# Patient Record
Sex: Male | Born: 1970
Health system: Southern US, Community
[De-identification: ages and names within clinical notes are randomized; demographics above are authoritative.]

## PROBLEM LIST (undated history)

## (undated) DIAGNOSIS — R040 Epistaxis: Secondary | ICD-10-CM

## (undated) DIAGNOSIS — T7840XA Allergy, unspecified, initial encounter: Secondary | ICD-10-CM

## (undated) DIAGNOSIS — E785 Hyperlipidemia, unspecified: Secondary | ICD-10-CM

## (undated) DIAGNOSIS — R062 Wheezing: Secondary | ICD-10-CM

## (undated) HISTORY — DX: Wheezing: R06.2

## (undated) HISTORY — DX: Epistaxis: R04.0

## (undated) HISTORY — DX: Hyperlipidemia, unspecified: E78.5

## (undated) HISTORY — DX: Allergy, unspecified, initial encounter: T78.40XA

---
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
## 2004-10-19 ENCOUNTER — Encounter: Admission: RE | Admit: 2004-10-19 | Discharge: 2004-10-19 | Payer: Self-pay | Admitting: Neurology

## 2006-10-28 ENCOUNTER — Ambulatory Visit: Payer: Self-pay | Admitting: Internal Medicine

## 2006-10-28 LAB — CONVERTED CEMR LAB
ALT: 27 units/L (ref 0–53)
AST: 29 units/L (ref 0–37)
Albumin: 4.4 g/dL (ref 3.5–5.2)
Alkaline Phosphatase: 60 units/L (ref 39–117)
BUN: 9 mg/dL (ref 6–23)
Basophils Absolute: 0 10*3/uL (ref 0.0–0.1)
Basophils Relative: 0.3 % (ref 0.0–1.0)
Bilirubin Urine: NEGATIVE
Bilirubin, Direct: 0.1 mg/dL (ref 0.0–0.3)
CO2: 30 meq/L (ref 19–32)
Calcium: 9.2 mg/dL (ref 8.4–10.5)
Chloride: 106 meq/L (ref 96–112)
Cholesterol: 247 mg/dL (ref 0–200)
Creatinine, Ser: 1 mg/dL (ref 0.4–1.5)
Direct LDL: 175.6 mg/dL
Eosinophils Absolute: 0 10*3/uL (ref 0.0–0.6)
Eosinophils Relative: 0.6 % (ref 0.0–5.0)
GFR calc Af Amer: 109 mL/min
GFR calc non Af Amer: 90 mL/min
Glucose, Bld: 96 mg/dL (ref 70–99)
HCT: 43.4 % (ref 39.0–52.0)
HDL: 41.3 mg/dL (ref >39.0)
Hemoglobin, Urine: NEGATIVE
Hemoglobin: 14.8 g/dL (ref 13.0–17.0)
Ketones, ur: NEGATIVE mg/dL
Leukocytes, UA: NEGATIVE
Lymphocytes Relative: 28.9 % (ref 12.0–46.0)
MCHC: 34.2 g/dL (ref 30.0–36.0)
MCV: 87 fL (ref 78.0–100.0)
Monocytes Absolute: 0.4 10*3/uL (ref 0.2–0.7)
Monocytes Relative: 6.3 % (ref 3.0–11.0)
Neutro Abs: 4.4 10*3/uL (ref 1.4–7.7)
Neutrophils Relative %: 63.9 % (ref 43.0–77.0)
Nitrite: NEGATIVE
Platelets: 248 10*3/uL (ref 150–400)
Potassium: 3.6 meq/L (ref 3.5–5.1)
RBC: 4.98 M/uL (ref 4.22–5.81)
RDW: 13 % (ref 11.5–14.6)
Sodium: 141 meq/L (ref 135–145)
Specific Gravity, Urine: 1.02 (ref 1.000–1.03)
TSH: 1.88 microintl units/mL (ref 0.35–5.50)
Total Bilirubin: 1.1 mg/dL (ref 0.3–1.2)
Total CHOL/HDL Ratio: 6
Total Protein, Urine: NEGATIVE mg/dL
Total Protein: 7.5 g/dL (ref 6.0–8.3)
Triglycerides: 93 mg/dL (ref 0–149)
Urine Glucose: NEGATIVE mg/dL
Urobilinogen, UA: 0.2 (ref 0.0–1.0)
VLDL: 19 mg/dL (ref 0–40)
WBC: 6.8 10*3/uL (ref 4.5–10.5)
pH: 6 (ref 5.0–8.0)

## 2006-11-04 ENCOUNTER — Ambulatory Visit: Payer: Self-pay | Admitting: Internal Medicine

## 2006-11-04 ENCOUNTER — Encounter: Payer: Self-pay | Admitting: Internal Medicine

## 2006-11-04 DIAGNOSIS — J309 Allergic rhinitis, unspecified: Secondary | ICD-10-CM | POA: Insufficient documentation

## 2006-11-04 DIAGNOSIS — K209 Esophagitis, unspecified without bleeding: Secondary | ICD-10-CM | POA: Insufficient documentation

## 2006-11-04 DIAGNOSIS — R0789 Other chest pain: Secondary | ICD-10-CM

## 2006-11-04 DIAGNOSIS — E782 Mixed hyperlipidemia: Secondary | ICD-10-CM | POA: Insufficient documentation

## 2006-12-16 ENCOUNTER — Ambulatory Visit: Payer: Self-pay | Admitting: Internal Medicine

## 2006-12-16 DIAGNOSIS — R05 Cough: Secondary | ICD-10-CM

## 2007-03-28 ENCOUNTER — Ambulatory Visit: Payer: Self-pay | Admitting: Endocrinology

## 2007-04-28 ENCOUNTER — Ambulatory Visit: Payer: Self-pay | Admitting: Internal Medicine

## 2007-04-28 DIAGNOSIS — J018 Other acute sinusitis: Secondary | ICD-10-CM | POA: Insufficient documentation

## 2007-04-29 ENCOUNTER — Telehealth: Payer: Self-pay | Admitting: Internal Medicine

## 2007-05-05 ENCOUNTER — Telehealth: Payer: Self-pay | Admitting: Internal Medicine

## 2007-05-09 ENCOUNTER — Ambulatory Visit: Payer: Self-pay | Admitting: Internal Medicine

## 2007-05-09 DIAGNOSIS — H6500 Acute serous otitis media, unspecified ear: Secondary | ICD-10-CM

## 2007-05-12 ENCOUNTER — Telehealth: Payer: Self-pay | Admitting: Internal Medicine

## 2007-05-19 ENCOUNTER — Telehealth: Payer: Self-pay | Admitting: Internal Medicine

## 2007-05-29 ENCOUNTER — Ambulatory Visit: Payer: Self-pay | Admitting: Internal Medicine

## 2007-10-23 ENCOUNTER — Telehealth: Payer: Self-pay | Admitting: Internal Medicine

## 2007-11-03 ENCOUNTER — Ambulatory Visit: Payer: Self-pay | Admitting: Internal Medicine

## 2009-01-20 ENCOUNTER — Ambulatory Visit: Payer: Self-pay | Admitting: Internal Medicine

## 2009-01-20 DIAGNOSIS — R519 Headache, unspecified: Secondary | ICD-10-CM | POA: Insufficient documentation

## 2009-01-20 DIAGNOSIS — J019 Acute sinusitis, unspecified: Secondary | ICD-10-CM

## 2009-01-20 DIAGNOSIS — R51 Headache: Secondary | ICD-10-CM

## 2009-01-20 DIAGNOSIS — R040 Epistaxis: Secondary | ICD-10-CM

## 2009-05-24 ENCOUNTER — Ambulatory Visit: Payer: Self-pay | Admitting: Internal Medicine

## 2009-05-27 ENCOUNTER — Telehealth: Payer: Self-pay | Admitting: Internal Medicine

## 2009-05-28 ENCOUNTER — Ambulatory Visit: Payer: Self-pay | Admitting: Family Medicine

## 2009-06-07 ENCOUNTER — Telehealth: Payer: Self-pay | Admitting: Internal Medicine

## 2009-12-12 ENCOUNTER — Ambulatory Visit: Payer: Self-pay | Admitting: Internal Medicine

## 2009-12-12 DIAGNOSIS — S9030XA Contusion of unspecified foot, initial encounter: Secondary | ICD-10-CM | POA: Insufficient documentation

## 2010-02-07 NOTE — Progress Notes (Signed)
Summary: Pt needs to change pulmonary appt  Phone Note Call from Patient Call back at Home Phone [phone]   Caller: Patient Reason for Call: Referral Summary of Call: Pt cannot make 6/1 pulmonary appt, pls call pt to give him the phone # so he can change his appt  Initial call taken by: Lannette Donath,  Jun 07, 2009 1:36 PM  Follow-up for Phone Call        Spoke with pt  phone # [phone]  pt will reschedule   Follow-up by: Darral Dash,  Jun 07, 2009 2:47 PM

## 2010-02-07 NOTE — Progress Notes (Signed)
Summary: Measle Vaccination  Phone Note Call from Patient Call back at Home Phone [phone]   Caller: Patient Summary of Call: patient called and left voice message stating he wil be leaving the country on Sunday and just found out the country that he will be traveling to has a mealses outbreak. He would like to know if will need a vaccination, he is uncertain if he has been vaccinated Initial call taken by: Glendell Docker CMA,  May 27, 2009 2:36 PM  Follow-up for Phone Call        I suggest he call travel clinic at [facility].  to see if they can administer MMR booster.   Follow-up by: D. Thomos Lemons DO,  May 27, 2009 5:23 PM  Additional Follow-up for Phone Call Additional follow up Details #1::        patient advised per Dr Artist Pais instructions. He states that his parents found his shot records and informed him that he had measles vaccine. He feels like has a 50/50 chance of getting exposed. He was informed that he could go to [facility] for MMR Booster, if needed. Patient states he has not decided on what to do, but will keep in mind his options Additional Follow-up by: Glendell Docker CMA,  May 27, 2009 5:36 PM

## 2010-02-07 NOTE — Assessment & Plan Note (Signed)
Summary: CPX/HEA   Vital Signs:  Patient profile:   40 year old male Height:      73 inches Weight:      212.50 pounds O2 Sat:      96 % on Room air Temp:     98.4 degrees F Pulse rate:   80 / minute Pulse rhythm:   regular Resp:     18 per minute BP sitting:   108 / 70  (right arm) Cuff size:   large  Vitals Entered By: Glendell Docker CMA (May 24, 2009 2:38 PM)  O2 Flow:  Room air CC: Rm 2- CPX   Primary Care Provider:  Dondra Spry DO  CC:  Rm 2- CPX.  History of Present Illness: 40 y/o white male for routine cpx overall doing well. still traveling freq for work.  wife has second son.    over 1 week, nagging cough and chest congestion co worker commented pt seems to have chronic cough all year round worse with cold , damp weather hx of allergic rhinitis  chronic nasal congestion   Preventive Screening-Counseling & Management  Alcohol-Tobacco     Alcohol drinks/day: <1 per week      Smoking Status: never  Caffeine-Diet-Exercise     Caffeine use/day: 4-6 beverages daily     Does Patient Exercise: no  Allergies (verified): No Known Drug Allergies  Past History:  Past Medical History: Exercise induced wheezing. 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  The patient denies fever, weight loss, weight gain, chest pain, dyspnea on exertion, abdominal pain, melena, hematochezia, severe indigestion/heartburn, depression, and testicular masses.    Physical Exam  General:  alert, well-developed, and well-nourished.   Head:  normocephalic and atraumatic.   Eyes:  pupils equal, pupils round, and pupils reactive  to light.   Ears:  R ear normal and L ear normal.   Mouth:  pharyngeal erythema and postnasal drip.   Neck:  supple, no masses, and no carotid bruits.   Lungs:  normal respiratory effort, normal breath sounds, no crackles, and no wheezes.   Heart:  normal rate, regular rhythm, no murmur, and no gallop.   Abdomen:  soft, no masses, no hepatomegaly, and no splenomegaly.  mild RLQ tenderness,  no hernia Genitalia:  circumcised and no scrotal masses.     Impression & Recommendations:  Problem # 1:  PREVENTIVE HEALTH CARE (ICD-V70.0) Reviewed adult health maintenance protocols. Pt counseled on diet and exercise.  Chol: 247 (10/28/2006)   HDL: 41.3 (10/28/2006)   LDL: DEL (10/28/2006)   TG: 93 (10/28/2006) TSH: 1.88 (10/28/2006)     Problem # 2:  COUGH (ICD-786.2) Pt with chronic intermittent cough.  He has recent exacerbation.  tx with ceftin and prednisone if needed. arrange PFTs and methacholine challenge.  I suspect he has underlying asthma. f/u in 2 months Orders: Pulmonary Referral (Pulmonary)  Complete Medication List: 1)  Fish Oil 1000 Mg Caps (Omega-3 fatty acids) .... Once daily 2)  Multivitamins Tabs (Multiple vitamin) .... Take 1 tablet by mouth once a day 3)  Cefuroxime Axetil 500 Mg Tabs (Cefuroxime axetil) .... One by mouth two  times a day 4)  Proair Hfa 108 (90 Base) Mcg/act Aers (Albuterol sulfate) .... 2 puffs 4 x daily as needed 5)  Prednisone 10 Mg Tabs (Prednisone) .... 3 tabs by mouth once daily x 3 days, 2 tabs by mouth once daily x 3 days, 1 tab by mouth once daily x 3 days  Other Orders: Tdap => 22yrs IM (23557) Admin 1st Vaccine (32202)  Patient Instructions: 1)  Please schedule a follow-up appointment in 2 months. 2)  Lipid Panel prior to visit, ICD-9: 272.4 3)  TSH prior to visit, ICD-9: 272.4 4)  AST, ALT:  272.4 5)  Please return for lab work one (1) week before your next appointment.  Prescriptions: PREDNISONE 10 MG TABS (PREDNISONE) 3 tabs by mouth  once daily x 3 days, 2 tabs by mouth once daily x 3 days, 1 tab by mouth once daily x 3 days  #18 x 0   Entered and Authorized by:   D. Thomos Lemons DO   Signed by:   D. Thomos Lemons DO on 05/24/2009   Method used:   Print then Give to Patient   RxID:   [id number] PROAIR HFA 108 (90 BASE) MCG/ACT AERS (ALBUTEROL SULFATE) 2 puffs 4 x daily as needed  #1 x 3   Entered and Authorized by:   D. Thomos Lemons DO   Signed by:   D. Thomos Lemons DO on 05/24/2009   Method used:   Electronically to        [facility])       [address]       [place], [place]  [address]       Ph: [phone]       Fax: [phone]   RxID:   [id number] CEFUROXIME AXETIL 500 MG TABS (CEFUROXIME AXETIL) one by mouth two times a day  #20 x 0   Entered and Authorized by:   D. Thomos Lemons DO   Signed by:   D. Thomos Lemons DO on 05/24/2009   Method used:   Electronically to        [facility] ([facility])       [address]       [place], [place]  [address]       Ph: [phone]       Fax: [phone]   RxID:   [id number]   Current Allergies (reviewed today): No known allergies      Immunizations Administered:  Tetanus Vaccine:    Vaccine Type: Tdap    Site: left deltoid    Mfr: GlaxoSmithKline    Dose: 0.5 ml    Route: IM    Given by: Glendell Docker CMA    Exp. Date: 04/02/2011    Lot #: [id number]    VIS given: 11/26/06 version given May 24, 2009.

## 2010-02-07 NOTE — Assessment & Plan Note (Signed)
Summary: MMR vaccine   Allergies: No Known Drug Allergies   Complete Medication List: 1)  Fish Oil 1000 Mg Caps (Omega-3 fatty acids) .... Once daily 2)  Multivitamins Tabs (Multiple vitamin) .... Take 1 tablet by mouth once a day 3)  Cefuroxime Axetil 500 Mg Tabs (Cefuroxime axetil) .... One by mouth two times a day 4)  Proair Hfa 108 (90 Base) Mcg/act Aers (Albuterol sulfate) .... 2 puffs 4 x daily as needed 5)  Prednisone 10 Mg Tabs (Prednisone) .... 3 tabs by mouth once daily x 3 days, 2 tabs by mouth once daily x 3 days, 1 tab by mouth once daily x 3 days  Other Orders: MMR Vaccine SQ (41324) Admin 1st Vaccine (40102) Admin 1st Vaccine [facility]) [phone])   MMR Vaccine # 2    Vaccine Type: MMR    Site: left deltoid    Dose: 0.5 ml    Route: IM    Given by: Payton Spark CMA    Exp. Date: 10/01/2009    Lot #: [id number]    VIS given: 03/21/06 version given May 28, 2009.

## 2010-02-07 NOTE — Assessment & Plan Note (Signed)
Summary: DR Olegario Messier PT/ FELL THIS MORNING/ HAD A NOSEBLEED/ NWS   Vital Signs:  Patient profile:   40 year old male Weight:      216 pounds BMI:     28.60 Temp:     98.3 degrees F oral Pulse rate:   78 / minute BP sitting:   116 / 84  (left arm)  Vitals Entered By: Tora Perches (January 20, 2009 3:45 PM) CC: injured his head Is Patient Diabetic? No   Primary Care Provider:  Dondra Spry DO  CC:  injured his head.  History of Present Illness: C/o tiredness, stressed out at work and w/travel  This am hit the tail gate of his Jeep w/L forehead - he walked into it. "Saw stars" - no LOC. C/o HA, nauseated. He had a nose bled at noon x 10-15 min  Preventive Screening-Counseling & Management  Alcohol-Tobacco     Smoking Status: never  Current Medications (verified): 1)  Fish Oil 1000 Mg Caps (Omega-3 Fatty Acids) .... Once Daily  Allergies (verified): No Known Drug Allergies  Past History:  Social History: Last updated: 01/20/2009 Married - with 1 child  Never Smoked Alcohol use-yes Regular exercise-no  Occupation:computers  Past Medical History: Exercise induced wheezing. Allergic rhinitis h/o nose bleeds as a child   Social History: Married - with 1 child  Never Smoked Alcohol use-yes Regular exercise-no  Occupation:computers  Review of Systems  The patient denies fever, vision loss, decreased hearing, chest pain, syncope, dyspnea on exertion, hemoptysis, abdominal pain, muscle weakness, and difficulty walking.    Physical Exam  General:  Well-developed,well-nourished,in no acute distress; alert,appropriate and cooperative throughout examination Head:  A bruise on L upper forehead 1 cm, tender Eyes:  No corneal or conjunctival inflammation noted. EOMI. Perrla.  Ears:  External ear exam shows no significant lesions or deformities.  Otoscopic examination reveals clear canals, tympanic membranes are intact bilaterally without bulging, retraction,  inflammation or discharge. Hearing is grossly normal bilaterally. Nose:  blood in L nostril - old Mouth:  Erythematous throat mucosa and intranasal erythema.  Neck:  supple and no masses.   Lungs:  normal respiratory effort and normal breath sounds.   Heart:  normal rate, regular rhythm, and no gallop.   Abdomen:  Bowel sounds positive,abdomen soft and non-tender without masses, organomegaly or hernias noted. Msk:  No deformity or scoliosis noted of thoracic or lumbar spine.   Pulses:  R and L carotid,radial,femoral,dorsalis pedis and posterior tibial pulses are full and equal bilaterally Extremities:  No clubbing, cyanosis, edema, or deformity noted with normal full range of motion of all joints.   Neurologic:  No cranial nerve deficits noted. Station and gait are normal. Plantar reflexes are down-going bilaterally. DTRs are symmetrical throughout. Sensory, motor and coordinative functions appear intact. Skin:  Intact without suspicious lesions or rashes Psych:  Cognition and judgment appear intact. Alert and cooperative with normal attention span and concentration. No apparent delusions, illusions, hallucinations   Impression & Recommendations:  Problem # 1:  EPISTAXIS (ICD-784.7) mild Assessment New NosebleedQR kit given. CBC offered and declined  Problem # 2:  HEADACHE (ICD-784.0) poss mild concussion Assessment: New Normal neuro exam. CT offered and declined. Clinically - no issues. Tylenol as needed Rest, get more sleep The office visit took longer than 20 min with patient councelling for more than 50% of the 20 min    Problem # 3:  SINUSITIS, ACUTE (ICD-461.9) possible Assessment: New  His updated medication list for this  problem includes:    Zithromax Z-pak 250 Mg Tabs (Azithromycin) .[facility]... As dirrected  Complete Medication List: 1)  Fish Oil 1000 Mg Caps (Omega-3 fatty acids) .... Once daily 2)  Zithromax Z-pak 250 Mg Tabs (Azithromycin) .... As dirrected  Patient  Instructions: 1)  Call if you are not better in a reasonable amount of time or if worse. Go to ER if feeling really bad! 2)  Call Dr Artist Pais if problems and f/u w/him. Prescriptions: ZITHROMAX Z-PAK 250 MG TABS (AZITHROMYCIN) as dirrected  #1 x 0   Entered and Authorized by:   Tresa Garter MD   Signed by:   Tresa Garter MD on 01/20/2009   Method used:   Electronically to        [facility] ([facility])       [address]       [place], [place]  [address]       Ph: [phone]       Fax: [phone]   RxID:   [id number]

## 2010-02-09 NOTE — Assessment & Plan Note (Signed)
Summary: ? BROKEN TOE/HEA   Vital Signs:  Patient profile:   40 year old male Height:      73 inches Weight:      220.25 pounds BMI:     29.16 O2 Sat:      97 % on Room air Temp:     98.3 degrees F oral Pulse rate:   105 / minute Resp:     18 per minute BP sitting:   124 / 80  (right arm) Cuff size:   large  Vitals Entered By: Glendell Docker CMA (December 12, 2009 2:14 PM)  O2 Flow:  Room air CC: Painful toe Is Patient Diabetic? No Pain Assessment Patient in pain? yes        Primary Care Provider:  Dondra Spry DO  CC:  Painful toe.  History of Present Illness: 40 y/o white male c/o bruise on left toe.   Saturday - he was playing with his son and went to get up too quickly and twisted his toes on his left foot.  mild pain / pressure sensation  Allergies (verified): No Known Drug Allergies  Past History:  Past Medical History: Exercise induced wheezing. Allergic rhinitis  h/o nose bleeds as a child    Past Surgical History: Rt arthroscopic knee surgery 1990 (wrestling injury)     Family History: Family History of Prostate CA 1st degree relative <50 Family History Thyroid disease Family History of Neurological disorder - mother (question MS vs Normal pressure hydrocephalus)    Social History: Married - 2 sons  Never Smoked Alcohol use-yes Regular exercise-no  Occupation: Arts administrator (Geneticist, molecular)   Physical Exam  General:  alert, well-developed, and well-nourished.   Msk:  mild bruise top of second digit of left foot   Impression & Recommendations:  Problem # 1:  CONTUSION OF FOOT (ICD-924.20) mild traumtic contusion of left second toe non tender to palpation I doubt fracture buddy tape to third digit Patient advised to call office if symptoms persist or worsen.  Complete Medication List: 1)  Fish Oil 1000 Mg Caps (Omega-3 fatty acids) .... Once daily 2)  Multivitamins Tabs (Multiple vitamin) .... Take 1 tablet by mouth once a  day  Other Orders: Flu Vaccine 58yrs + (14782) Admin 1st Vaccine (95621)   Orders Added: 1)  Flu Vaccine 11yrs + [30865] 2)  Admin 1st Vaccine [90471] 3)  Est. Patient Level III [78469]   Immunizations Administered:  Influenza Vaccine # 1:    Vaccine Type: Fluvax 3+    Site: left deltoid    Mfr: GlaxoSmithKline    Dose: 0.5 ml    Route: IM    Given by: Glendell Docker CMA    Exp. Date: 07/08/2010    Lot #: [id number]    VIS given: 08/02/09 version given December 12, 2009.  Flu Vaccine Consent Questions:    Do you have a history of severe allergic reactions to this vaccine? no    Any prior history of allergic reactions to egg and/or gelatin? no    Do you have a sensitivity to the preservative Thimersol? no    Do you have a past history of Guillan-Barre Syndrome? no    Do you currently have an acute febrile illness? no    Have you ever had a severe reaction to latex? no    Vaccine information given and explained to patient? yes   Immunizations Administered:  Influenza Vaccine # 1:    Vaccine Type: Fluvax 3+  Site: left deltoid    Mfr: GlaxoSmithKline    Dose: 0.5 ml    Route: IM    Given by: Glendell Docker CMA    Exp. Date: 07/08/2010    Lot #: [id number]    VIS given: 08/02/09 version given December 12, 2009.  Current Allergies (reviewed today): No known allergies

## 2010-06-29 ENCOUNTER — Encounter: Payer: Self-pay | Admitting: Family

## 2010-06-29 DIAGNOSIS — R062 Wheezing: Secondary | ICD-10-CM | POA: Insufficient documentation

## 2010-06-30 ENCOUNTER — Encounter: Payer: Self-pay | Admitting: Internal Medicine

## 2010-06-30 ENCOUNTER — Encounter: Payer: Self-pay | Admitting: Family

## 2010-06-30 ENCOUNTER — Ambulatory Visit (INDEPENDENT_AMBULATORY_CARE_PROVIDER_SITE_OTHER): Payer: 59 | Admitting: Family

## 2010-06-30 VITALS — BP 120/70 | HR 90 | Temp 98.3°F | Resp 16 | Wt 216.1 lb

## 2010-06-30 DIAGNOSIS — H6690 Otitis media, unspecified, unspecified ear: Secondary | ICD-10-CM | POA: Insufficient documentation

## 2010-06-30 MED ORDER — AMOXICILLIN 500 MG PO CAPS: 500.0000 mg | ORAL_CAPSULE | Freq: Three times a day (TID) | ORAL | Status: DC

## 2010-06-30 NOTE — Progress Notes (Signed)
  Subjective:    Patient ID: Stephen Bridges, male    DOB: 1970/10/11, 40 y.o.   MRN: [id number]  HPI    Review of Systems     Objective:   Physical Exam  Constitutional: He appears well-developed and well-nourished.  HENT:  Head: Normocephalic and atraumatic.  Right Ear: Tympanic membrane is erythematous.  Left Ear: Tympanic membrane is erythematous.  Mouth/Throat: Oropharynx is clear and moist. No oropharyngeal exudate.       Bilateral TM erythema R>L  Eyes: Conjunctivae are normal. Pupils are equal, round, and reactive to light.  Neck: Normal range of motion. Neck supple. No thyromegaly present.  Cardiovascular: Normal rate and regular rhythm.   Pulmonary/Chest: Effort normal and breath sounds normal.  Lymphadenopathy:    He has no cervical adenopathy.          Assessment & Plan:   Subjective:     Stephen Bridges is a 40 y.o. male who presents for evaluation of symptoms of a URI. Symptoms include congestion, cough described as productive at times., no  fever and sinus pressure.Also reports a sore throat.  Onset of symptoms was 5 days ago, and has been gradually worsening since that time. Treatment to date: decongestants.    Review of System  Pertinent items are noted in HPI.   Objective:   Assessment:    Plan:

## 2010-06-30 NOTE — Assessment & Plan Note (Signed)
Will treat with amoxicillin, pt instructed to contact us if his symptoms worsen or if they do not improve in the next 2-3 days.

## 2010-06-30 NOTE — Patient Instructions (Signed)
Call if your symptoms worsen or if you are not improved in 48-72 hours.

## 2010-07-13 ENCOUNTER — Telehealth: Payer: Self-pay | Admitting: Internal Medicine

## 2010-07-13 NOTE — Telephone Encounter (Signed)
Pt states that he has been on amoxicillan for the past two weeks. He started getting a rash a couple of days ago. Pt noticed the rash appeared after he worked out. Pt states that the rash comes and goes. Pt states he is not sure if that would be a side effect to the amoxicillan. I offered pt appt but he declined.

## 2010-07-13 NOTE — Telephone Encounter (Signed)
Ok to stop amoxicillin if began 6/22. Confirm no tongue swelling/dyspne/difficulty swallowing. If rash does not improve or worsens absolutely needs to be seen

## 2010-07-13 NOTE — Telephone Encounter (Signed)
Call placed to patient at [phone], no answer. A detailed voice message was left informing patient per Dr Rodena Medin instruction. Message was left for patient to return phone call to confirm that he has not had any swelling of the tongue, shortness of breath, or difficulty swallowing.

## 2010-07-14 NOTE — Telephone Encounter (Signed)
Call placed to patient [phone], he states that he has not had any difficulty  Swallowing, shortness of breath or swelling of his tongue. He stated he has just the rash; which he states he will monitor. Patient advised if no improvement in rash he will need office visit. Patient states he will give it a few days and schedule appointment next week if it does not resolve.

## 2010-07-20 ENCOUNTER — Encounter: Payer: Self-pay | Admitting: Internal Medicine

## 2010-07-20 ENCOUNTER — Ambulatory Visit (INDEPENDENT_AMBULATORY_CARE_PROVIDER_SITE_OTHER): Payer: 59 | Admitting: Internal Medicine

## 2010-07-20 DIAGNOSIS — R21 Rash and other nonspecific skin eruption: Secondary | ICD-10-CM

## 2010-07-20 MED ORDER — PREDNISONE 10 MG PO TABS: 20.0000 mg | ORAL_TABLET | Freq: Every day | ORAL | Status: AC

## 2010-07-23 DIAGNOSIS — R21 Rash and other nonspecific skin eruption: Secondary | ICD-10-CM | POA: Insufficient documentation

## 2010-07-23 NOTE — Progress Notes (Signed)
  Subjective:    Patient ID: Stephen Bridges, male    DOB: 1970-11-23, 40 y.o.   MRN: [id number]  HPI Patient presents to clinic for evaluation of rash. Recently treated with course of amoxicillin for otitis media. Previously tolerated amoxicillin without difficulty. During treatment developed diffuse itchy erythematous rash. Stop amoxicillin approximately one week ago. Rash is better today but has persisted. Has used topical over-the-counter medication. Denies shortness of breath, tongue swelling or difficulty swallowing. No other exacerbating or alleviating factors. No obvious other triggers. No other complaints.  Reviewed past medical history, medications and allergies    Review of Systems see history of present illness     Objective:   Physical Exam  Nursing note and vitals reviewed. Constitutional: He appears well-developed and well-nourished.  HENT:  Head: Normocephalic and atraumatic.  Nose: Nose normal.  Mouth/Throat: Oropharynx is clear and moist.  Eyes: Conjunctivae are normal. No scleral icterus.  Neck: Neck supple.  Neurological: He is alert.  Skin: Skin is warm and dry. Rash noted. There is erythema. No pallor.       Diffuse mildly erythematous maculopapular rash. No dermatomal distribution. No oral or ocular involvement.  Psychiatric: He has a normal mood and affect.          Assessment & Plan:

## 2010-07-23 NOTE — Assessment & Plan Note (Signed)
Only recent possible trigger would be amoxicillin which has been taken without difficulty in the past. Avoid amoxicillin. Attempt over-the-counter Zyrtec as needed. Begin short course of prednisone. Followup if no improvement or worsening.

## 2010-12-13 ENCOUNTER — Encounter: Payer: Self-pay | Admitting: Internal Medicine

## 2010-12-13 ENCOUNTER — Ambulatory Visit (INDEPENDENT_AMBULATORY_CARE_PROVIDER_SITE_OTHER): Payer: 59 | Admitting: Internal Medicine

## 2010-12-13 VITALS — BP 122/74 | Temp 100.2°F | Ht 73.0 in | Wt 179.0 lb

## 2010-12-13 DIAGNOSIS — R509 Fever, unspecified: Secondary | ICD-10-CM

## 2010-12-13 DIAGNOSIS — J111 Influenza due to unidentified influenza virus with other respiratory manifestations: Secondary | ICD-10-CM | POA: Insufficient documentation

## 2010-12-13 LAB — POCT INFLUENZA A/B
Influenza A, POC: POSITIVE
Influenza B, POC: POSITIVE

## 2010-12-13 MED ORDER — OSELTAMIVIR PHOSPHATE 75 MG PO CAPS: 75.0000 mg | ORAL_CAPSULE | Freq: Two times a day (BID) | ORAL | Status: AC

## 2010-12-13 MED ORDER — OSELTAMIVIR PHOSPHATE 75 MG PO CAPS: 75.0000 mg | ORAL_CAPSULE | Freq: Two times a day (BID) | ORAL | Status: DC

## 2010-12-13 NOTE — Patient Instructions (Signed)
Drink plenty of fluids. Please call our office if your symptoms do not improve or gets worse.  

## 2010-12-13 NOTE — Assessment & Plan Note (Signed)
40 year old presents with acute symptoms of influenza confirmed with nasal/oral swab. His symptoms started within 24 hrs.  Treat with Tamiflu 75 mg twice a day x5 days. Push fluids.   OTC analgesics as needed.  Patient advised to call office if symptoms persist or worsen.

## 2010-12-13 NOTE — Progress Notes (Signed)
  Subjective:    Patient ID: Stephen Bridges, male    DOB: 04/13/1970, 40 y.o.   MRN: [id number]  URI  This is a new problem. The current episode started yesterday. The maximum temperature recorded prior to his arrival was 100 - 100.9 F. The fever has been present for less than 1 day. Associated symptoms include sinus pain.   Wife has been recently diagnosed with bronchitis.  His son also has been ill with URI.   Review of Systems Mild nausea which he associates with swallowing mucus,  Skin feels hypersensitive.  He feels achy  Past Medical History  Diagnosis Date  . Wheezing     exercise induced  . Allergy     allergic rhinitis  . Nosebleed     history of nosebleeds as child    History   Social History  . Marital Status: Married    Spouse Name: N/A    Number of Children: N/A  . Years of Education: N/A   Occupational History  . Not on file.   Social History Main Topics  . Smoking status: Never Smoker   . Smokeless tobacco: Never Used  . Alcohol Use: Yes  . Drug Use: Not on file  . Sexually Active: Not on file   Other Topics Concern  . Not on file   Social History Narrative   Regular exercise:  NoMarried--2 sonsOccupation: computers (system implementation)    Past Surgical History  Procedure Date  . Knee arthroscopy 1990    right knee, wrestling injury    Family History  Problem Relation Age of Onset  . Thyroid disease Other   . Cancer Other     prostate    No Known Allergies  Current Outpatient Prescriptions on File Prior to Visit  Medication Sig Dispense Refill  . fish oil-omega-3 fatty acids 1000 MG capsule Take 1 g by mouth daily.        . Multiple Vitamin (MULTIVITAMIN) tablet Take 1 tablet by mouth daily.          BP 122/74  Temp(Src) 100.2 F (37.9 C) (Oral)  Ht 6\' 1"  (1.854 m)  Wt 179 lb (81.194 kg)  BMI 23.62 kg/m2     Objective:   Physical Exam  Constitutional: He is oriented to person, place, and time. He appears well-developed and  well-nourished.       Ill-appearing  HENT:  Head: Normocephalic and atraumatic.  Right Ear: External ear normal.  Left Ear: External ear normal.       Oropharyngeal erythema  Eyes: Pupils are equal, round, and reactive to light.       Mild conjunctival injection  Cardiovascular: Normal rate, regular rhythm and normal heart sounds.   Pulmonary/Chest: Breath sounds normal. No respiratory distress. He has no wheezes.  Neurological: He is alert and oriented to person, place, and time.  Skin: Skin is warm and dry.  Psychiatric: He has a normal mood and affect. His behavior is normal.           Assessment & Plan:

## 2011-07-25 ENCOUNTER — Encounter: Payer: Self-pay | Admitting: Internal Medicine

## 2011-07-25 ENCOUNTER — Ambulatory Visit (INDEPENDENT_AMBULATORY_CARE_PROVIDER_SITE_OTHER): Payer: 59 | Admitting: Internal Medicine

## 2011-07-25 VITALS — BP 110/76 | Temp 98.1°F | Wt 188.0 lb

## 2011-07-25 DIAGNOSIS — J309 Allergic rhinitis, unspecified: Secondary | ICD-10-CM

## 2011-07-25 DIAGNOSIS — J069 Acute upper respiratory infection, unspecified: Secondary | ICD-10-CM

## 2011-07-25 NOTE — Patient Instructions (Signed)
Get plenty of rest, Drink lots of  clear liquids, and use Tylenol or ibuprofen for fever and discomfort.    

## 2011-07-25 NOTE — Progress Notes (Signed)
  Subjective:    Patient ID: Stephen Bridges, male    DOB: 03/27/1970, 41 y.o.   MRN: [id number]  HPI  41 year old patient who is seen today with a two-day history of head congestion fatigue and a general sense of unwellness. There is a minor nonproductive cough. No documented fever. His wife has a similar illness. He is concerned because he will be doing international travel soon. He has had a otitis media in the setting of a URI in the past which was quite painful with fine.    Review of Systems  Constitutional: Positive for fatigue.  HENT: Positive for congestion and rhinorrhea.   Respiratory: Positive for cough.        Objective:   Physical Exam  Constitutional: He is oriented to person, place, and time. He appears well-developed.  HENT:  Head: Normocephalic.  Right Ear: External ear normal.  Left Ear: External ear normal.  Eyes: Conjunctivae and EOM are normal.  Neck: Normal range of motion.  Cardiovascular: Normal rate and normal heart sounds.   Pulmonary/Chest: Breath sounds normal.  Abdominal: Bowel sounds are normal.  Musculoskeletal: Normal range of motion. He exhibits no edema and no tenderness.  Neurological: He is alert and oriented to person, place, and time.  Psychiatric: He has a normal mood and affect. His behavior is normal.          Assessment & Plan:   Viral URI with cough. We'll treat symptomatically

## 2012-01-09 HISTORY — PX: WISDOM TOOTH EXTRACTION: SHX21

## 2012-02-14 ENCOUNTER — Encounter: Payer: Self-pay | Admitting: Internal Medicine

## 2012-02-14 ENCOUNTER — Ambulatory Visit (INDEPENDENT_AMBULATORY_CARE_PROVIDER_SITE_OTHER): Payer: 59 | Admitting: Internal Medicine

## 2012-02-14 VITALS — BP 124/90 | Temp 98.2°F | Wt 196.0 lb

## 2012-02-14 DIAGNOSIS — J069 Acute upper respiratory infection, unspecified: Secondary | ICD-10-CM

## 2012-02-14 MED ORDER — AMOXICILLIN 875 MG PO TABS: 875.0000 mg | ORAL_TABLET | Freq: Two times a day (BID) | ORAL | Status: DC

## 2012-02-14 NOTE — Assessment & Plan Note (Addendum)
42 year old white male with signs and symptoms of probable viral URI for 2 weeks. He has bilateral sinus congestion and cough. Patient advised to continue symptomatic treatment for now. If worsening symptoms or persistent sinus congestion, patient to use amoxicillin 875 mg twice daily for 10 days.

## 2012-02-14 NOTE — Patient Instructions (Addendum)
Gargle with warm salt water Use nasal saline spray as directed Use mucinex over the counter as directed Please contact our office if your symptoms do not improve or gets worse.

## 2012-02-14 NOTE — Progress Notes (Signed)
  Subjective:    Patient ID: Stephen Bridges, male    DOB: 04-20-1970, 42 y.o.   MRN: [id number]  HPI  42 year old white male with history of hyperlipidemia complains of upper respiratory congestion and cough for 2 weeks. Patient has 2 young children at home with similar symptoms. He denies any fever or chills. Cough is nonproductive. He denies any wheezing or shortness of breath.  Patient having 4 with some teeth removed by her oral surgeon tomorrow. They plan to use conscious sedation.  Review of Systems See HPI  Past Medical History  Diagnosis Date  . Wheezing     exercise induced  . Allergy     allergic rhinitis  . Nosebleed     history of nosebleeds as child    History   Social History  . Marital Status: Married    Spouse Name: N/A    Number of Children: N/A  . Years of Education: N/A   Occupational History  . Not on file.   Social History Main Topics  . Smoking status: Never Smoker   . Smokeless tobacco: Never Used  . Alcohol Use: Yes  . Drug Use: Not on file  . Sexually Active: Not on file   Other Topics Concern  . Not on file   Social History Narrative   Regular exercise:  NoMarried--2 sonsOccupation: computers (system implementation)    Past Surgical History  Procedure Date  . Knee arthroscopy 1990    right knee, wrestling injury    Family History  Problem Relation Age of Onset  . Thyroid disease Other   . Cancer Other     prostate    No Known Allergies  Current Outpatient Prescriptions on File Prior to Visit  Medication Sig Dispense Refill  . fish oil-omega-3 fatty acids 1000 MG capsule Take 1 g by mouth daily.        . Multiple Vitamin (MULTIVITAMIN) tablet Take 1 tablet by mouth daily.          BP 124/90  Temp 98.2 F (36.8 C) (Oral)  Wt 196 lb (88.905 kg)       Objective:   Physical Exam  Constitutional: He is oriented to person, place, and time. He appears well-developed and well-nourished. No distress.  HENT:  Head:  Normocephalic and atraumatic.  Right Ear: External ear normal.  Left Ear: External ear normal.  Mouth/Throat: No oropharyngeal exudate.       Oropharyngeal erythema  Neck: Neck supple.       No neck tenderness  Cardiovascular: Normal rate, regular rhythm and normal heart sounds.   Pulmonary/Chest: Effort normal and breath sounds normal. He has no wheezes.  Lymphadenopathy:    He has no cervical adenopathy.  Neurological: He is alert and oriented to person, place, and time.  Psychiatric: He has a normal mood and affect. His behavior is normal.          Assessment & Plan:

## 2012-02-20 ENCOUNTER — Ambulatory Visit (INDEPENDENT_AMBULATORY_CARE_PROVIDER_SITE_OTHER): Payer: 59 | Admitting: Internal Medicine

## 2012-02-20 ENCOUNTER — Encounter: Payer: Self-pay | Admitting: Internal Medicine

## 2012-02-20 VITALS — BP 124/92 | Temp 97.9°F | Wt 192.0 lb

## 2012-02-20 DIAGNOSIS — H109 Unspecified conjunctivitis: Secondary | ICD-10-CM

## 2012-02-20 NOTE — Assessment & Plan Note (Signed)
42 year old white male has signs symptoms or early viral conjunctivitis. Patient advised to use saline rinse 5-6 times per day.  Patient advised to call office if symptoms persist or worsen.

## 2012-02-20 NOTE — Progress Notes (Signed)
  Subjective:    Patient ID: Stephen Bridges, male    DOB: [birth year], 42 y.o.   MRN: [id number]  HPI  42 year old white male previously seen for upper respiratory infection for followup. Patient reports cough has improved but not completely resolved. He recently had 4 wisdom teeth extracted. He is currently taking amoxicillin.  He has 2 young children at home. One son diagnosed with bilateral pinkeye. Patient has noticed slight redness of his left eye and right eye with "gritty" /dry sensation. He denies any eye pain or changes in vision. He has mild eye discharge in the morning.  Review of Systems Negative for eye pain or visual changes.  Past Medical History  Diagnosis Date  . Wheezing     exercise induced  . Allergy     allergic rhinitis  . Nosebleed     history of nosebleeds as child    History   Social History  . Marital Status: Married    Spouse Name: N/A    Number of Children: N/A  . Years of Education: N/A   Occupational History  . Not on file.   Social History Main Topics  . Smoking status: Never Smoker   . Smokeless tobacco: Never Used  . Alcohol Use: Yes  . Drug Use: Not on file  . Sexually Active: Not on file   Other Topics Concern  . Not on file   Social History Narrative   Regular exercise:  No   Married--2 sons   Occupation: computers (Geneticist, molecular)    Past Surgical History  Procedure Laterality Date  . Knee arthroscopy  1990    right knee, wrestling injury    Family History  Problem Relation Age of Onset  . Thyroid disease Other   . Cancer Other     prostate    No Known Allergies  Current Outpatient Prescriptions on File Prior to Visit  Medication Sig Dispense Refill  . amoxicillin (AMOXIL) 875 MG tablet Take 1 tablet (875 mg total) by mouth 2 (two) times daily.  20 tablet  0  . fish oil-omega-3 fatty acids 1000 MG capsule Take 1 g by mouth daily.        . Multiple Vitamin (MULTIVITAMIN) tablet Take 1 tablet by mouth daily.          No current facility-administered medications on file prior to visit.    BP 124/92  Temp(Src) 97.9 F (36.6 C) (Oral)  Wt 192 lb (87.091 kg)  BMI 25.34 kg/m2       Objective:   Physical Exam  Constitutional: He is oriented to person, place, and time. He appears well-developed and well-nourished.  Eyes: EOM are normal. Pupils are equal, round, and reactive to light.  Mild left conjunctival injection, no eye drainage  Cardiovascular: Normal rate, regular rhythm and normal heart sounds.   Pulmonary/Chest: Effort normal and breath sounds normal. He has no wheezes.  Neurological: He is alert and oriented to person, place, and time. No cranial nerve deficit.  Psychiatric: He has a normal mood and affect. His behavior is normal.          Assessment & Plan:

## 2012-02-20 NOTE — Patient Instructions (Addendum)
Flush your eyes with saline 5-6 times per day Please contact our office if your symptoms do not improve or gets worse.

## 2012-06-12 ENCOUNTER — Encounter: Payer: Self-pay | Admitting: Internal Medicine

## 2012-06-12 ENCOUNTER — Ambulatory Visit (INDEPENDENT_AMBULATORY_CARE_PROVIDER_SITE_OTHER): Payer: 59 | Admitting: Internal Medicine

## 2012-06-12 VITALS — BP 122/84 | Temp 98.3°F | Wt 188.0 lb

## 2012-06-12 DIAGNOSIS — R05 Cough: Secondary | ICD-10-CM | POA: Insufficient documentation

## 2012-06-12 DIAGNOSIS — D179 Benign lipomatous neoplasm, unspecified: Secondary | ICD-10-CM

## 2012-06-12 DIAGNOSIS — J45991 Cough variant asthma: Secondary | ICD-10-CM

## 2012-06-12 MED ORDER — BECLOMETHASONE DIPROPIONATE 80 MCG/ACT IN AERS: 2.0000 | INHALATION_SPRAY | Freq: Two times a day (BID) | RESPIRATORY_TRACT | Status: DC

## 2012-06-12 MED ORDER — AZITHROMYCIN 250 MG PO TABS: ORAL_TABLET | ORAL | Status: DC

## 2012-06-12 NOTE — Assessment & Plan Note (Signed)
42 year old chronic cough for 2 weeks. I suspect he may have cough variant asthma. Treat with azithromycin.  Also start Qvar 80 mcg 2 puffs twice daily. Obtain chest x-ray.

## 2012-06-12 NOTE — Assessment & Plan Note (Signed)
Patient discovered soft tissue mass along right lower ribs 4-5 days ago. I suspect he has a lipoma. I recommend we observe for now. If it becomes symptomatic, we discussed referral to surgeon for excision.

## 2012-06-12 NOTE — Patient Instructions (Signed)
Use Allegra 180 mg once daily (over the counter)

## 2012-06-12 NOTE — Progress Notes (Signed)
  Subjective:    Patient ID: Stephen Bridges, male    DOB: 27-Dec-1970, 42 y.o.   MRN: [id number]  HPI  42 year old white male with history of allergic rhinitis and exercise-induced wheezing complains of intermittent cough for 2 weeks. His symptoms have been progressively worsening. He reports he is particularly congested in the morning and at night. He reports "his bronchial tubes feel irritated". He has had mild yellowish/greenish sputum only in the morning. He denies any active wheeze.  We have questioned whether he might have mild asthma in the past.  Patient has also noticed small soft tissue lump right lower ribs. Area is nontender.  Review of Systems Negative for SOB, negative for chest pain  Past Medical History  Diagnosis Date  . Wheezing     exercise induced  . Allergy     allergic rhinitis  . Nosebleed     history of nosebleeds as child    History   Social History  . Marital Status: Married    Spouse Name: N/A    Number of Children: N/A  . Years of Education: N/A   Occupational History  . Not on file.   Social History Main Topics  . Smoking status: Never Smoker   . Smokeless tobacco: Never Used  . Alcohol Use: Yes  . Drug Use: Not on file  . Sexually Active: Not on file   Other Topics Concern  . Not on file   Social History Narrative   Regular exercise:  No   Married--2 sons   Occupation: computers (Geneticist, molecular)    Past Surgical History  Procedure Laterality Date  . Knee arthroscopy  1990    right knee, wrestling injury    Family History  Problem Relation Age of Onset  . Thyroid disease Other   . Cancer Other     prostate    No Known Allergies  Current Outpatient Prescriptions on File Prior to Visit  Medication Sig Dispense Refill  . fish oil-omega-3 fatty acids 1000 MG capsule Take 1 g by mouth daily.        . Multiple Vitamin (MULTIVITAMIN) tablet Take 1 tablet by mouth daily.         No current facility-administered medications  on file prior to visit.    BP 122/84  Temp(Src) 98.3 F (36.8 C) (Oral)  Wt 188 lb (85.276 kg)  BMI 24.81 kg/m2       Objective:   Physical Exam  Constitutional: He is oriented to person, place, and time. He appears well-developed and well-nourished.  HENT:  Head: Normocephalic and atraumatic.  Right Ear: External ear normal.  Left Ear: External ear normal.  Oropharyngeal erythema, nasal turbinates with irritated appearance  Neck: Neck supple.  Cardiovascular: Normal rate, regular rhythm and normal heart sounds.   No murmur heard. Pulmonary/Chest: Effort normal and breath sounds normal. He has no wheezes.  Musculoskeletal: He exhibits no edema.  Lymphadenopathy:    He has no cervical adenopathy.  Neurological: He is alert and oriented to person, place, and time. No cranial nerve deficit.  Skin:  2 x 4 centimeter oblong soft tissue mass along right lower ribs          Assessment & Plan:

## 2012-06-18 ENCOUNTER — Telehealth: Payer: Self-pay | Admitting: Internal Medicine

## 2012-06-18 NOTE — Telephone Encounter (Signed)
F/u appt is 07/10/12

## 2012-06-18 NOTE — Telephone Encounter (Signed)
Pt called and stated that he has been unable to complete his chest xray, due to work obligations. He was calling to see if he would wait until his f/u visit to have this completed, or if it needed to be high priority. Please assist.

## 2012-06-19 ENCOUNTER — Telehealth: Payer: Self-pay | Admitting: *Deleted

## 2012-06-19 NOTE — Telephone Encounter (Signed)
Ok to wait on CXR

## 2012-06-19 NOTE — Telephone Encounter (Signed)
Pt informed

## 2012-06-19 NOTE — Telephone Encounter (Signed)
done

## 2012-07-08 ENCOUNTER — Ambulatory Visit: Payer: 59 | Admitting: Internal Medicine

## 2012-07-09 ENCOUNTER — Ambulatory Visit: Payer: 59 | Admitting: Internal Medicine

## 2012-07-09 ENCOUNTER — Ambulatory Visit (INDEPENDENT_AMBULATORY_CARE_PROVIDER_SITE_OTHER): Payer: 59 | Admitting: Family Medicine

## 2012-07-09 ENCOUNTER — Encounter: Payer: Self-pay | Admitting: Family Medicine

## 2012-07-09 VITALS — BP 120/66 | HR 89 | Temp 98.4°F | Wt 185.0 lb

## 2012-07-09 DIAGNOSIS — R05 Cough: Secondary | ICD-10-CM

## 2012-07-09 MED ORDER — OMEPRAZOLE 40 MG PO CPDR: 40.0000 mg | DELAYED_RELEASE_CAPSULE | Freq: Every day | ORAL | Status: DC

## 2012-07-09 NOTE — Progress Notes (Signed)
  Subjective:    Patient ID: Stephen Bridges, male    DOB: November 06, 1970, 42 y.o.   MRN: [id number]  HPI Here to follow up on a chronic dry cough. He has seen Dr. Artist Pais for this recently and he thought it may be related to allergies. He put him on Allegra and Qvar but these have not helped. He feels fine in general. He does admit to having more indigestion and belching than usual lately. No SOB or chest pain.    Review of Systems  Constitutional: Negative.   HENT: Negative.   Eyes: Negative.   Respiratory: Positive for cough. Negative for chest tightness, shortness of breath and wheezing.   Cardiovascular: Negative.        Objective:   Physical Exam  Constitutional: He appears well-developed and well-nourished.  Cardiovascular: Normal rate, regular rhythm, normal heart sounds and intact distal pulses.   Pulmonary/Chest: Effort normal and breath sounds normal. No respiratory distress. He has no wheezes. He has no rales.  Lymphadenopathy:    He has no cervical adenopathy.          Assessment & Plan:  His cough may be from GERD. He will stop the Allegra and the Qvar. Start on Omeprazole 40 mg daily. Recheck in 2 weeks

## 2012-07-10 ENCOUNTER — Ambulatory Visit: Payer: 59 | Admitting: Internal Medicine

## 2012-07-24 ENCOUNTER — Ambulatory Visit (INDEPENDENT_AMBULATORY_CARE_PROVIDER_SITE_OTHER): Payer: 59 | Admitting: Internal Medicine

## 2012-07-24 ENCOUNTER — Encounter: Payer: Self-pay | Admitting: Internal Medicine

## 2012-07-24 ENCOUNTER — Telehealth: Payer: Self-pay | Admitting: Internal Medicine

## 2012-07-24 VITALS — BP 124/82 | HR 80 | Temp 98.0°F

## 2012-07-24 DIAGNOSIS — K219 Gastro-esophageal reflux disease without esophagitis: Secondary | ICD-10-CM

## 2012-07-24 DIAGNOSIS — R233 Spontaneous ecchymoses: Secondary | ICD-10-CM

## 2012-07-24 DIAGNOSIS — T50905A Adverse effect of unspecified drugs, medicaments and biological substances, initial encounter: Secondary | ICD-10-CM

## 2012-07-24 DIAGNOSIS — T887XXA Unspecified adverse effect of drug or medicament, initial encounter: Secondary | ICD-10-CM

## 2012-07-24 MED ORDER — PANTOPRAZOLE SODIUM 40 MG PO TBEC: 40.0000 mg | DELAYED_RELEASE_TABLET | Freq: Every day | ORAL | Status: DC

## 2012-07-24 NOTE — Progress Notes (Signed)
Subjective:    Patient ID: Stephen Bridges, male    DOB: [birth year], 42 y.o.   MRN: [id number]  HPI  Pt presents to the clinic today with c/o a rash. He noticed the rash this am. It appears to be small areas of blood under the skin. He is not sure if this is a reaction from the prilosec which he started 10 days ago. He has not come in contact with anything that he is allergic to. The rash does not itch. He has not put anything on it. He has never had a rash like this in the past.  Review of Systems      Past Medical History  Diagnosis Date  . Wheezing     exercise induced  . Allergy     allergic rhinitis  . Nosebleed     history of nosebleeds as child    Current Outpatient Prescriptions  Medication Sig Dispense Refill  . fish oil-omega-3 fatty acids 1000 MG capsule Take 1 g by mouth daily.        . Multiple Vitamin (MULTIVITAMIN) tablet Take 1 tablet by mouth daily.        Marland Kitchen omeprazole (PRILOSEC) 40 MG capsule Take 1 capsule (40 mg total) by mouth daily.  30 capsule  2   No current facility-administered medications for this visit.    No Known Allergies  Family History  Problem Relation Age of Onset  . Thyroid disease Other   . Cancer Other     prostate    History   Social History  . Marital Status: Married    Spouse Name: N/A    Number of Children: N/A  . Years of Education: N/A   Occupational History  . Not on file.   Social History Main Topics  . Smoking status: Never Smoker   . Smokeless tobacco: Never Used  . Alcohol Use: Yes     Comment: rare  . Drug Use: No  . Sexually Active: Not on file   Other Topics Concern  . Not on file   Social History Narrative   Regular exercise:  No   Married--2 sons   Occupation: computers (Geneticist, molecular)     Constitutional: Denies fever, malaise, fatigue, headache or abrupt weight changes.  Respiratory: Denies difficulty breathing, shortness of breath, cough or sputum production.   Cardiovascular: Denies  chest pain, chest tightness, palpitations or swelling in the hands or feet.  Skin: Pt reports rash on BLE. Denies lesions or ulcercations.  Neurological: Pt reports lightheadedness. Denies difficulty with memory, difficulty with speech or problems with balance and coordination.   No other specific complaints in a complete review of systems (except as listed in HPI above).  Objective:   Physical Exam   BP 124/82  Pulse 80  Temp(Src) 98 F (36.7 C) (Oral)  SpO2 96% Wt Readings from Last 3 Encounters:  07/09/12 185 lb (83.915 kg)  06/12/12 188 lb (85.276 kg)  02/20/12 192 lb (87.091 kg)    General: Appears his stated age, well developed, well nourished in NAD. Skin: Warm, dry and intact. No lesions or ulcerations noted. Petechial rash noted on BLE rmal range of motion. Neck supple, trachea midline. No massses, lumps or thyromegaly present.  Cardiovascular: Normal rate and rhythm. S1,S2 noted.  No murmur, rubs or gallops noted. No JVD or BLE edema. No carotid bruits noted. Pulmonary/Chest: Normal effort and positive vesicular breath sounds. No respiratory distress. No wheezes, rales or ronchi noted.    BMET  Component Value Date/Time   NA 141 10/28/2006 0921   K 3.6 10/28/2006 0921   CL 106 10/28/2006 0921   CO2 30 10/28/2006 0921   GLUCOSE 96 10/28/2006 0921   BUN 9 10/28/2006 0921   CREATININE 1.0 10/28/2006 0921   CALCIUM 9.2 10/28/2006 0921   GFRNONAA 90 10/28/2006 0921   GFRAA 109 10/28/2006 0921    Lipid Panel     Component Value Date/Time   CHOL 247* 10/28/2006 0921   TRIG 93 10/28/2006 0921   HDL 41.3 10/28/2006 0921   CHOLHDL 6.0 CALC 10/28/2006 0921   VLDL 19 10/28/2006 0921    CBC    Component Value Date/Time   WBC 6.8 10/28/2006 0921   RBC 4.98 10/28/2006 0921   HGB 14.8 10/28/2006 0921   HCT 43.4 10/28/2006 0921   PLT 248 10/28/2006 0921   MCV 87.0 10/28/2006 0921   MCHC 34.2 10/28/2006 0921   RDW 13.0 10/28/2006 0921   MONOABS 0.4 10/28/2006  0921   EOSABS 0.0 10/28/2006 0921   BASOSABS 0.0 10/28/2006 0921    Hgb A1C No results found for this basename: HGBA1C        Assessment & Plan:   Petechiae secondary to allergic reaction from medication, new onset:  Stop prilosec Erx for protonix 40 mg daily in its place  RTC as needed or if symptoms persist or worsen.

## 2012-07-24 NOTE — Telephone Encounter (Signed)
Patient Information:  Caller Name: Mohit  Phone: [phone]  Patient: Stephen Bridges  Gender: Male  DOB: 12-12-1970  Age: 42 Years  PCP: Artist Pais Doe-Hyun Molly Maduro) (Adults only)  Office Follow Up:  Does the office need to follow up with this patient?: No  Instructions For The Office: N/A  RN Note:  Patient calling about fatigue, lightheadedness and new onset rash on legs.  Has been taking prilosec 40mg  x 10 days, and worries that his fatigue and lightheadedness is related to this new medication, based on the patient insert.  c/o pimple like rash on lower legs.  Rash looks like red fine spots "like blood" under the skin.  Rash appeared AM 07/24/12.  Rash is not itchy.  Afebrile.  Per rash protocol, advised appt today for "localized purple or blood-colored dots not from friction or injury"; appt scheduled 07/24/12 1545 at [facility] office with [facility] due to no appts available at [facility].  krs/can  Symptoms  Reason For Call & Symptoms: prilosec side effects  Reviewed Health History In EMR: Yes  Reviewed Medications In EMR: Yes  Reviewed Allergies In EMR: Yes  Reviewed Surgeries / Procedures: Yes  Date of Onset of Symptoms: 07/24/2012  Guideline(s) Used:  Rash or Redness - Localized  Disposition Per Guideline:   See Today in Office  Reason For Disposition Reached:   Localized purple or blood-colored spots or dots that are not from injury or friction (no fever)  Advice Given:  N/A  Patient Will Follow Care Advice:  YES  Appointment Scheduled:  07/24/2012 15:45:00 Appointment Scheduled Provider:  N/A

## 2012-07-24 NOTE — Patient Instructions (Signed)
Omeprazole tablets (OTC) What is this medicine? OMEPRAZOLE (oh ME pray zol) prevents the production of acid in the stomach. It is used to treat the symptoms of heartburn. You can buy this medicine without a prescription. This product is not for long-term use, unless otherwise directed by your doctor or health care professional. This medicine may be used for other purposes; ask your health care provider or pharmacist if you have questions. What should I tell my health care provider before I take this medicine? They need to know if you have any of these conditions: -black or bloody stools -chest pain -difficulty swallowing -have had heartburn for over 3 months -have heartburn with dizziness, lightheadedness or sweating -liver disease -stomach pain -unexplained weight loss -vomiting with blood -wheezing -an unusual or allergic reaction to omeprazole, other medicines, foods, dyes, or preservatives -pregnant or trying to get pregnant -breast-feeding How should I use this medicine? Take this medicine by mouth. Follow the directions on the product label. If you are taking this medicine without a prescription, take one tablet every day. Do not use for longer than 14 days or repeat a course of treatment more often than every 4 months unless directed by a doctor or healthcare professional. Take your dose at regular intervals every 24 hours. Swallow the tablet whole with a drink of water. Do not crush, break or chew. This medicine works best if taken on an empty stomach 30 minutes before breakfast. If you are using this medicine with the prescription of your doctor or healthcare professional, follow the directions you were given. Do not take your medicine more often than directed. Talk to your pediatrician regarding the use of this medicine in children. Special care may be needed. Overdosage: If you think you have taken too much of this medicine contact a poison control center or emergency room at  once. NOTE: This medicine is only for you. Do not share this medicine with others. What if I miss a dose? If you miss a dose, take it as soon as you can. If it is almost time for your next dose, take only that dose. Do not take double or extra doses. What may interact with this medicine? Do not take this medicine with any of the following medications: -atazanavir -clopidogrel -nelfinavir This medicine may also interact with the following medications: -ampicillin -certain medicines for anxiety or sleep -certain medicines that treat or prevent blood clots like warfarin -cyclosporine -diazepam -digoxin -disulfiram -iron salts -phenytoin -prescription medicine for fungal or yeast infection like itraconazole, ketoconazole, voriconazole -saquinavir -tacrolimus This list may not describe all possible interactions. Give your health care provider a list of all the medicines, herbs, non-prescription drugs, or dietary supplements you use. Also tell them if you smoke, drink alcohol, or use illegal drugs. Some items may interact with your medicine. What should I watch for while using this medicine? It can take several days before your heartburn gets better. Check with your doctor or health care professional if your condition does not start to get better, or if it gets worse. Do not treat diarrhea with over the counter products. Contact your doctor if you have diarrhea that lasts more than 2 days or if it is severe and watery. Do not treat yourself for heartburn with this medicine for more than 14 days in a row. You should only use this medicine for a 2-week treatment period once every 4 months. If your symptoms return shortly after your therapy is complete, or within the 4 month time  frame, call your doctor or health care professional. What side effects may I notice from receiving this medicine? Side effects that you should report to your doctor or health care professional as soon as  possible: -allergic reactions like skin rash, itching or hives, swelling of the face, lips, or tongue -bone, muscle or joint pain -breathing problems -chest pain or chest tightness -dark yellow or brown urine -diarrhea -dizziness -fast, irregular heartbeat -feeling faint or lightheaded -fever or sore throat -muscle spasm -palpitations -redness, blistering, peeling or loosening of the skin, including inside the mouth -seizures -tremors -unusual bleeding or bruising -unusually weak or tired -yellowing of the eyes or skin Side effects that usually do not require medical attention (Report these to your doctor or health care professional if they continue or are bothersome.): -constipation -dry mouth -headache -loose stools -nausea This list may not describe all possible side effects. Call your doctor for medical advice about side effects. You may report side effects to FDA at 1-800-FDA-1088. Where should I keep my medicine? Keep out of the reach of children. Store at room temperature between 20 and 25 degrees C (68 and 77 degrees F). Protect from light and moisture. Throw away any unused medicine after the expiration date. NOTE: This sheet is a summary. It may not cover all possible information. If you have questions about this medicine, talk to your doctor, pharmacist, or health care provider.  2013, [employer]. (09/25/2010 11:40:25 AM)

## 2012-07-24 NOTE — Telephone Encounter (Signed)
Noted  

## 2012-07-29 ENCOUNTER — Telehealth: Payer: Self-pay | Admitting: Family Medicine

## 2012-07-29 NOTE — Telephone Encounter (Signed)
Caller: Stephen Bridges/Patient; Phone: [phone]; Reason for Call: Medication question.  Pt was given Protonix on 7-19 by [facility] office after having reaction to Omeprazole: fatigue, foggy, winded and rash on legs.  Pt states his symptoms are improving.  Pt wanting 2nd opinion before starting Protonix since med is in same drug catagory.  Consulted w/ [facility] Pharmacist, [address] near [facility], [phone], Pharmacist agreed drugs are in same catagory and could possibiliy give same reaction, suggested Zantac/Ranitidine.  Appt offered w/ Dr Clent Ridges to dicuss.  Appt scheduled at 0930 on 7-22.

## 2012-07-30 ENCOUNTER — Ambulatory Visit: Payer: Self-pay | Admitting: Family Medicine

## 2012-07-30 NOTE — Telephone Encounter (Addendum)
Pt would like to know if you think he should try the Zantac/Ranitidine. (per note) Pt has not been on any med since last Thurs.  Pt would like to try something before he has to come back in.  [facility]/ [address]

## 2012-07-30 NOTE — Telephone Encounter (Signed)
Yes, he can try Zantac 150 mg OTC bid

## 2012-07-30 NOTE — Telephone Encounter (Signed)
I left voice message with below information. 

## 2012-08-15 ENCOUNTER — Telehealth: Payer: Self-pay | Admitting: Internal Medicine

## 2012-08-15 NOTE — Telephone Encounter (Signed)
Patient calling about prilosec reaction; seen 3 weeks ago and stopped taking it, but the replacement was similar, and so Dr. Clent Ridges advised Zantac OTC.  States the packaging says to not take > 14 days without seeing MD.  Taking 2 150mg  tabs daily.  Wants to know if he should be seeing MD again before continuing more.  Has been improving only marginally on the zantac.  Declines new triage.  Advised appt; appt scheduled 08/18/12 1415 with Dr. Artist Pais.  krs/can

## 2012-08-15 NOTE — Telephone Encounter (Signed)
Noted  

## 2012-08-18 ENCOUNTER — Encounter: Payer: Self-pay | Admitting: Internal Medicine

## 2012-08-18 ENCOUNTER — Other Ambulatory Visit: Payer: Self-pay | Admitting: Internal Medicine

## 2012-08-18 ENCOUNTER — Ambulatory Visit (INDEPENDENT_AMBULATORY_CARE_PROVIDER_SITE_OTHER): Payer: 59 | Admitting: Internal Medicine

## 2012-08-18 VITALS — BP 120/80 | HR 76 | Temp 98.5°F | Wt 185.0 lb

## 2012-08-18 DIAGNOSIS — K219 Gastro-esophageal reflux disease without esophagitis: Secondary | ICD-10-CM

## 2012-08-18 DIAGNOSIS — R0602 Shortness of breath: Secondary | ICD-10-CM

## 2012-08-18 DIAGNOSIS — J309 Allergic rhinitis, unspecified: Secondary | ICD-10-CM

## 2012-08-18 DIAGNOSIS — R05 Cough: Secondary | ICD-10-CM

## 2012-08-18 LAB — CBC WITH DIFFERENTIAL/PLATELET
Basophils Absolute: 0 10*3/uL (ref 0.0–0.1)
Basophils Relative: 0.4 % (ref 0.0–3.0)
Eosinophils Absolute: 0 10*3/uL (ref 0.0–0.7)
Eosinophils Relative: 0.2 % (ref 0.0–5.0)
HCT: 45.3 % (ref 39.0–52.0)
Hemoglobin: 15.2 g/dL (ref 13.0–17.0)
Lymphocytes Relative: 20.7 % (ref 12.0–46.0)
Lymphs Abs: 1.2 10*3/uL (ref 0.7–4.0)
MCHC: 33.6 g/dL (ref 30.0–36.0)
MCV: 89.7 fl (ref 78.0–100.0)
Monocytes Absolute: 0.3 10*3/uL (ref 0.1–1.0)
Monocytes Relative: 4.7 % (ref 3.0–12.0)
Neutro Abs: 4.4 10*3/uL (ref 1.4–7.7)
Neutrophils Relative %: 74 % (ref 43.0–77.0)
Platelets: 218 10*3/uL (ref 150.0–400.0)
RBC: 5.05 Mil/uL (ref 4.22–5.81)
RDW: 13.5 % (ref 11.5–14.6)
WBC: 5.9 10*3/uL (ref 4.5–10.5)

## 2012-08-18 LAB — BASIC METABOLIC PANEL
BUN: 15 mg/dL (ref 6–23)
CO2: 29 mEq/L (ref 19–32)
Calcium: 9.6 mg/dL (ref 8.4–10.5)
Chloride: 106 mEq/L (ref 96–112)
Creatinine, Ser: 0.9 mg/dL (ref 0.4–1.5)
GFR: 95.72 mL/min (ref >60.00)
Glucose, Bld: 90 mg/dL (ref 70–99)
Potassium: 3.5 mEq/L (ref 3.5–5.1)
Sodium: 141 mEq/L (ref 135–145)

## 2012-08-18 LAB — H. PYLORI ANTIBODY, IGG: H Pylori IgG: NEGATIVE

## 2012-08-18 NOTE — Assessment & Plan Note (Addendum)
Patient has mild reflux symptoms. He had allergic reaction to omeprazole. 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 Avoid proton pump inhibitors in the future. It sounds like he had rebound reflux after discontinuing PPI.  Continue ranitidine 150 mg twice daily. Antireflux measures reviewed. Discontinue all over-the-counter vitamins as they may be exacerbating his symptoms. Check H. Pylori antibody.

## 2012-08-18 NOTE — Progress Notes (Addendum)
Subjective:    Patient ID: Stephen Bridges, male    DOB: 08-17-1970, 42 y.o.   MRN: [id number]  HPI  42 year old white male with history of allergic rhinitis previously seen for possible bronchitis for followup. Patient did not have any improvement with the using Qvar. He was seen by Dr. Clent Ridges who suspected he might have GERD triggered throat irritation/cough. He was started on omeprazole 40 mg once daily. After taking medications for 2 weeks, he developed unexplained rash in his lower extremities. Rash was nonpruritic. Lesions were pimple-sized" and resolved after stopping medication. While he had a rash, he describes associated lethargy/fatigue and felt "out of it".  He was later seen at the office by nurse practitioner. He was switched to protonix but later decided to switch to ranitidine considering protonix also proton pump inhibitor. Patient noticed when he stopped omeprazole,  he experienced burning in his upper esophagus. He did not have this issue before starting proton pump inhibitor. His symptoms now improved with taking ranitidine 150 mg twice daily.  Patient still continues to cough. He also complains of intermittent shortness of breath. Shortness of breath is not acute.  He has noticed need to take a deep breath when he sings hymns in church.  He attributes this to lack of exercise. He denies any chest pain.   Review of Systems Negative for chest pain. No orthopnea or leg swelling  Past Medical History  Diagnosis Date  . Wheezing     exercise induced  . Allergy     allergic rhinitis  . Nosebleed     history of nosebleeds as child    History   Social History  . Marital Status: Married    Spouse Name: N/A    Number of Children: N/A  . Years of Education: N/A   Occupational History  . Not on file.   Social History Main Topics  . Smoking status: Never Smoker   . Smokeless tobacco: Never Used  . Alcohol Use: Yes     Comment: rare  . Drug Use: No  . Sexually Active: Not  on file   Other Topics Concern  . Not on file   Social History Narrative   Regular exercise:  No   Married--2 sons   Occupation: computers (Geneticist, molecular)    Past Surgical History  Procedure Laterality Date  . Knee arthroscopy  1990    right knee, wrestling injury    Family History  Problem Relation Age of Onset  . Thyroid disease Other   . Cancer Other     prostate    No Known Allergies  No current outpatient prescriptions on file prior to visit.   No current facility-administered medications on file prior to visit.    BP 120/80  Pulse 76  Temp(Src) 98.5 F (36.9 C) (Oral)  Wt 185 lb (83.915 kg)  BMI 24.41 kg/m2  EKG and spirometry reviewed with patient.      Objective:   Physical Exam  Constitutional: He is oriented to person, place, and time. He appears well-developed and well-nourished.  HENT:  Head: Normocephalic and atraumatic.  Right Ear: External ear normal.  Left Ear: External ear normal.  Signs of postnasal drip  Cardiovascular: Normal rate, regular rhythm and normal heart sounds.   No murmur heard. Pulmonary/Chest: Effort normal and breath sounds normal. He has no wheezes.  Abdominal: Soft. Bowel sounds are normal. There is no tenderness.  Musculoskeletal: He exhibits no edema.  Neurological: He is alert and oriented to  person, place, and time. No cranial nerve deficit.  Skin: Skin is warm and dry. No rash noted.  Psychiatric: He has a normal mood and affect. His behavior is normal.          Assessment & Plan:

## 2012-08-18 NOTE — Assessment & Plan Note (Signed)
Chronic cough may still be secondary to postnasal drip from allergic rhinitis. Check [place] allergy panel.

## 2012-08-18 NOTE — Assessment & Plan Note (Signed)
No improvement with inhaled corticosteroids. Office spirometry negative for obstruction. He is experiencing unexplained intermittent shortness of breath. Consider possibility of interstitial lung disease. Obtain full PFTs. Obtain chest x-ray as planned.

## 2012-08-19 LAB — ~~LOC~~ ALLERGY PANEL
Allergen, Cedar tree, t12: <0.1 kU/L
Allergen, Comm Silver Birch, t9: <0.1 kU/L
Allergen, D pternoyssinus,d7: <0.1 kU/L
Allergen, Mulberry, t76: <0.1 kU/L
Alternaria Alternata: <0.1 kU/L
Aspergillus fumigatus, m3: <0.1 kU/L
Bahia Grass: <0.1 kU/L
Bermuda Grass: <0.1 kU/L
Box Elder IgE: <0.1 kU/L
Cat Dander: <0.1 kU/L
Cladosporium Herbarum: <0.1 kU/L
Cockroach: <0.1 kU/L
Common Ragweed: <0.1 kU/L
D. farinae: <0.1 kU/L
Dog Dander: <0.1 kU/L
Elm IgE: <0.1 kU/L
Johnson Grass: <0.1 kU/L
Mucor Racemosus: <0.1 kU/L
Mugwort: <0.1 kU/L
Nettle: <0.1 kU/L
Oak: <0.1 kU/L
Pecan/Hickory Tree IgE: <0.1 kU/L
Penicillium Notatum: <0.1 kU/L
Plantain: <0.1 kU/L
Rough Pigweed  IgE: <0.1 kU/L
Sheep Sorrel IgE: <0.1 kU/L
Stemphylium Botryosum: <0.1 kU/L
Sweet Gum: <0.1 kU/L
Timothy Grass: <0.1 kU/L

## 2012-08-19 LAB — THYROID ANTIBODIES
Thyroglobulin Ab: <20 U/mL (ref <40.0)
Thyroperoxidase Ab SerPl-aCnc: 12.3 IU/mL (ref <35.0)

## 2012-08-19 LAB — T4, FREE: Free T4: 0.92 ng/dL (ref 0.60–1.60)

## 2012-08-20 LAB — TSH: TSH: 2.006 u[IU]/mL (ref 0.350–4.500)

## 2012-08-22 ENCOUNTER — Encounter: Payer: Self-pay | Admitting: Internal Medicine

## 2012-09-04 ENCOUNTER — Ambulatory Visit (INDEPENDENT_AMBULATORY_CARE_PROVIDER_SITE_OTHER)
Admission: RE | Admit: 2012-09-04 | Discharge: 2012-09-04 | Disposition: A | Discharge status: Home / Self Care | Payer: 59 | Source: Ambulatory Visit | Attending: Internal Medicine | Admitting: Internal Medicine

## 2012-09-04 ENCOUNTER — Ambulatory Visit (INDEPENDENT_AMBULATORY_CARE_PROVIDER_SITE_OTHER): Payer: 59 | Admitting: Internal Medicine

## 2012-09-04 ENCOUNTER — Encounter: Payer: Self-pay | Admitting: Internal Medicine

## 2012-09-04 DIAGNOSIS — R05 Cough: Secondary | ICD-10-CM

## 2012-09-04 LAB — PULMONARY FUNCTION TEST

## 2012-09-04 NOTE — Progress Notes (Signed)
PFT done today. 

## 2012-09-22 ENCOUNTER — Telehealth: Payer: Self-pay | Admitting: Internal Medicine

## 2012-09-22 ENCOUNTER — Ambulatory Visit: Payer: 59 | Admitting: Internal Medicine

## 2012-09-22 NOTE — Telephone Encounter (Signed)
To: [facility] (After Hours Triage) Fax: [phone] From: [facility] Date/ Time: 09/20/2012 11:38 AM Taken By: Yehuda Savannah, CSR Caller: Royal [facility]thorn Facility: home Patient: Stephen Bridges, Stephen Bridges DOB: 1970/09/10 Phone: [phone] Reason for Call: ATTENTION:!!! Stephen Bridges (DOB: 1970/07/23) called on Saturday (September 20, 2012 at 11:35 AM), to CANCEL his APPOINTMENT on September 22, 2012, he forgot what time, because Dr. Artist Pais had told him he no longer needed to keep the Appointment. NOTE: I wasn't able to cancel in EPIC. Regarding Appointment:

## 2012-09-23 ENCOUNTER — Encounter: Payer: Self-pay | Admitting: Internal Medicine

## 2012-10-24 ENCOUNTER — Other Ambulatory Visit (INDEPENDENT_AMBULATORY_CARE_PROVIDER_SITE_OTHER): Payer: 59

## 2012-10-24 DIAGNOSIS — Z0000 Encounter for general adult medical examination without abnormal findings: Secondary | ICD-10-CM

## 2012-10-24 LAB — HEPATIC FUNCTION PANEL
ALT: 27 U/L (ref 0–53)
AST: 26 U/L (ref 0–37)
Albumin: 4.7 g/dL (ref 3.5–5.2)
Alkaline Phosphatase: 53 U/L (ref 39–117)
Bilirubin, Direct: 0.1 mg/dL (ref 0.0–0.3)
Total Bilirubin: 1.3 mg/dL — ABNORMAL HIGH (ref 0.3–1.2)
Total Protein: 7.4 g/dL (ref 6.0–8.3)

## 2012-10-24 LAB — CBC WITH DIFFERENTIAL/PLATELET
Basophils Absolute: 0 10*3/uL (ref 0.0–0.1)
Basophils Relative: 0.3 % (ref 0.0–3.0)
Eosinophils Absolute: 0 10*3/uL (ref 0.0–0.7)
Eosinophils Relative: 0.8 % (ref 0.0–5.0)
HCT: 44.2 % (ref 39.0–52.0)
Hemoglobin: 15.1 g/dL (ref 13.0–17.0)
Lymphocytes Relative: 32.1 % (ref 12.0–46.0)
Lymphs Abs: 1.8 10*3/uL (ref 0.7–4.0)
MCHC: 34.2 g/dL (ref 30.0–36.0)
MCV: 88.7 fl (ref 78.0–100.0)
Monocytes Absolute: 0.3 10*3/uL (ref 0.1–1.0)
Monocytes Relative: 6.2 % (ref 3.0–12.0)
Neutro Abs: 3.4 10*3/uL (ref 1.4–7.7)
Neutrophils Relative %: 60.6 % (ref 43.0–77.0)
Platelets: 231 10*3/uL (ref 150.0–400.0)
RBC: 4.98 Mil/uL (ref 4.22–5.81)
RDW: 13.6 % (ref 11.5–14.6)
WBC: 5.5 10*3/uL (ref 4.5–10.5)

## 2012-10-24 LAB — POCT URINALYSIS DIPSTICK
Bilirubin, UA: NEGATIVE
Blood, UA: NEGATIVE
Glucose, UA: NEGATIVE
Ketones, UA: NEGATIVE
Leukocytes, UA: NEGATIVE
Nitrite, UA: NEGATIVE
Protein, UA: NEGATIVE
Spec Grav, UA: <=1.005
Urobilinogen, UA: 0.2
pH, UA: 6

## 2012-10-24 LAB — BASIC METABOLIC PANEL
BUN: 13 mg/dL (ref 6–23)
CO2: 29 mEq/L (ref 19–32)
Calcium: 9.4 mg/dL (ref 8.4–10.5)
Chloride: 102 mEq/L (ref 96–112)
Creatinine, Ser: 1 mg/dL (ref 0.4–1.5)
GFR: 87.88 mL/min (ref >60.00)
Glucose, Bld: 88 mg/dL (ref 70–99)
Potassium: 3.6 mEq/L (ref 3.5–5.1)
Sodium: 140 mEq/L (ref 135–145)

## 2012-10-24 LAB — LIPID PANEL
Cholesterol: 220 mg/dL — ABNORMAL HIGH (ref 0–200)
HDL: 44.4 mg/dL (ref >39.00)
Total CHOL/HDL Ratio: 5
Triglycerides: 98 mg/dL (ref 0.0–149.0)
VLDL: 19.6 mg/dL (ref 0.0–40.0)

## 2012-10-24 LAB — LDL CHOLESTEROL, DIRECT: Direct LDL: 148.9 mg/dL

## 2012-10-24 LAB — TSH: TSH: 2.01 u[IU]/mL (ref 0.35–5.50)

## 2012-10-30 ENCOUNTER — Encounter: Payer: 59 | Admitting: Internal Medicine

## 2012-11-04 ENCOUNTER — Encounter: Payer: 59 | Admitting: Internal Medicine

## 2012-11-05 ENCOUNTER — Ambulatory Visit (INDEPENDENT_AMBULATORY_CARE_PROVIDER_SITE_OTHER): Payer: 59 | Admitting: Internal Medicine

## 2012-11-05 ENCOUNTER — Encounter: Payer: Self-pay | Admitting: Internal Medicine

## 2012-11-05 VITALS — BP 114/72 | HR 76 | Temp 98.1°F | Ht 72.0 in | Wt 189.0 lb

## 2012-11-05 DIAGNOSIS — D179 Benign lipomatous neoplasm, unspecified: Secondary | ICD-10-CM

## 2012-11-05 DIAGNOSIS — Z0000 Encounter for general adult medical examination without abnormal findings: Secondary | ICD-10-CM

## 2012-11-05 DIAGNOSIS — R05 Cough: Secondary | ICD-10-CM

## 2012-11-05 DIAGNOSIS — Z23 Encounter for immunization: Secondary | ICD-10-CM

## 2012-11-05 MED ORDER — FLUTICASONE PROPIONATE 50 MCG/ACT NA SUSP: 2.0000 | Freq: Every day | NASAL | Status: DC

## 2012-11-05 NOTE — Patient Instructions (Signed)
Please complete the following lab tests before your next follow up appointment: CPX including PSA - V76.44

## 2012-11-05 NOTE — Progress Notes (Signed)
Subjective:    Patient ID: Stephen Bridges, male    DOB: 09-21-1970, 42 y.o.   MRN: [id number]  HPI  42 year old white male with chronic intermittent cough for routine physical. Patient denies any significant interval medical history. He hasn't been hospitalized or had any surgeries. Patient completed pulmonary function testing in August of 2014. Vision had normal lung volumes. No evidence of reversible airway disease and normal diffusion. His chest x-ray was also normal.  Patient has been trying to follow a more healthy diet. He is experiencing issues with significant constipation recently. It has improved with stool softeners. He denies any melena or hematochezia.  He denies any family history of colorectal cancer.  Patient still has probable lipoma near right lower ribs.  Review of Systems   Constitutional: Negative for activity change, appetite change and unexpected weight change.  Eyes: Negative for visual disturbance.  Respiratory: Negative for cough, chest tightness and shortness of breath.   Cardiovascular: Negative for chest pain.  Genitourinary: Negative for difficulty urinating.  Neurological: Negative for headaches.  Gastrointestinal: Negative for abdominal pain, heartburn melena or hematochezia Psych: Negative for depression or anxiety Endo:  No polyuria or polydypsia        Past Medical History  Diagnosis Date  . Wheezing     exercise induced  . Allergy     allergic rhinitis  . Nosebleed     history of nosebleeds as child    History   Social History  . Marital Status: Married    Spouse Name: N/A    Number of Children: N/A  . Years of Education: N/A   Occupational History  . Not on file.   Social History Main Topics  . Smoking status: Never Smoker   . Smokeless tobacco: Never Used  . Alcohol Use: Yes     Comment: rare  . Drug Use: No  . Sexual Activity: Not on file   Other Topics Concern  . Not on file   Social History Narrative   Regular  exercise:  No   Married--2 sons   Occupation: computers (Geneticist, molecular)    Past Surgical History  Procedure Laterality Date  . Knee arthroscopy  1990    right knee, wrestling injury    Family History  Problem Relation Age of Onset  . Thyroid disease Other   . Cancer Other     prostate    Allergies  Allergen Reactions  . Omeprazole Rash    No current outpatient prescriptions on file prior to visit.   No current facility-administered medications on file prior to visit.    BP 114/72  Pulse 76  Temp(Src) 98.1 F (36.7 C) (Oral)  Ht 6' (1.829 m)  Wt 189 lb (85.73 kg)  BMI 25.63 kg/m2    Objective:   Physical Exam  Constitutional: He is oriented to person, place, and time. He appears well-developed and well-nourished. No distress.  HENT:  Head: Normocephalic and atraumatic.  Right Ear: External ear normal.  Left Ear: External ear normal.  Mouth/Throat: Oropharynx is clear and moist.  Eyes: Conjunctivae and EOM are normal. Pupils are equal, round, and reactive to light. No scleral icterus.  Neck: Normal range of motion. Neck supple.  Cardiovascular: Normal rate, regular rhythm and normal heart sounds.   No murmur heard. Pulmonary/Chest: Effort normal and breath sounds normal. He has no wheezes.  Abdominal: Bowel sounds are normal. There is no tenderness.  Genitourinary: Rectum normal. Guaiac negative stool.  Musculoskeletal: Normal range of motion. He  exhibits no edema.  Lymphadenopathy:    He has no cervical adenopathy.  Neurological: He is alert and oriented to person, place, and time. No cranial nerve deficit.  Skin: Skin is warm and dry.  Psychiatric: He has a normal mood and affect. His behavior is normal.          Assessment & Plan:

## 2012-11-05 NOTE — Assessment & Plan Note (Addendum)
Right lower rib subcutaneous mass likely lipoma. Refer to local surgeon for possible biopsy/excision.

## 2012-11-05 NOTE — Assessment & Plan Note (Signed)
Reviewed adult health maintenance protocols.  Patient updated with influenza vaccine. His weight is normal. His lipid panel is within acceptable limits. Patient having some minor issues with constipation. His rectal exam was normal. Suggest using psyllium fiber laxative as well as increasing his intake of mild saturated fats (extra virgin olive oil).  Patient advised to contact her office if constipation issue persistent or worsens.

## 2012-11-05 NOTE — Assessment & Plan Note (Signed)
Patient's pulmonary function test and chest x-ray were normal. His symptoms likely secondary to postnasal drip. Use at bedtime nasal steroids as directed.  [place] allergy panel was negative

## 2012-11-11 ENCOUNTER — Ambulatory Visit (INDEPENDENT_AMBULATORY_CARE_PROVIDER_SITE_OTHER): Payer: 59 | Admitting: Surgery

## 2012-11-25 ENCOUNTER — Encounter (INDEPENDENT_AMBULATORY_CARE_PROVIDER_SITE_OTHER): Payer: Self-pay | Admitting: Surgery

## 2012-11-25 ENCOUNTER — Ambulatory Visit (INDEPENDENT_AMBULATORY_CARE_PROVIDER_SITE_OTHER): Payer: 59 | Admitting: Surgery

## 2012-11-25 VITALS — BP 122/72 | HR 80 | Temp 98.0°F | Resp 18 | Ht 73.0 in | Wt 187.0 lb

## 2012-11-25 DIAGNOSIS — D179 Benign lipomatous neoplasm, unspecified: Secondary | ICD-10-CM

## 2012-11-25 NOTE — Patient Instructions (Signed)
If the lipoma on your right rib cage gets larger or painful, come back to see [place]

## 2012-11-25 NOTE — Progress Notes (Signed)
NAME: Stephen Bridges DOB: 25-Apr-1970 MRN: [id number]                                                                                      DATE: 11/25/2012  PCP: Thomos Lemons, DO Referring Provider: Artist Pais, Doe-Hyun R, DO  IMPRESSION:  Lipoma right lateral chest wall  PLAN:   Discussed excision vs monitoring for increase in size and/or symptoms. This appears to be benign to me. He will monitor for changes and return PRN                 CC:  Chief Complaint  Patient presents with  . Mass    lipoma rt side    HPI:  Stephen Bridges is a 42 y.o.  male who presents for evaluation of a soft mass on the right rib cage. It was first noted about six months ago, has not changed and is asymptomatic.. Dr Artist Pais has asked for our opinion  PMH:  has a past medical history of Wheezing; Allergy; and Nosebleed.  PSH:   has past surgical history that includes Knee arthroscopy (1990) and Wisdom tooth extraction (2014).  ALLERGIES:   Allergies  Allergen Reactions  . Omeprazole Rash    MEDICATIONS: Current outpatient prescriptions:fluticasone (FLONASE) 50 MCG/ACT nasal spray, Place 2 sprays into the nose daily., Disp: 16 g, Rfl: 11  ROS: He has filled out our 12 point review of systems and it is negative . EXAM:   VS: BP 122/72  Pulse 80  Temp(Src) 98 F (36.7 C)  Resp 18  Ht 6\' 1"  (1.854 m)  Wt 187 lb (84.823 kg)  BMI 24.68 kg/m2 General:Alert, NAD Chest wall: Soft well circumscribed mobile sub q mass 5X3 cm in size  DATA REVIEWED:  Epic notes    Zaneta Lightcap J 11/25/2012  CC: Artist Pais, Doe-Hyun R, DO, Thomos Lemons, DO

## 2013-03-09 ENCOUNTER — Telehealth: Payer: Self-pay | Admitting: Internal Medicine

## 2013-03-09 DIAGNOSIS — H539 Unspecified visual disturbance: Secondary | ICD-10-CM

## 2013-03-09 NOTE — Telephone Encounter (Signed)
Saw an ophthalmologist 2 wks ago and he recommended an neurology referral. He states he has "flickering" vision and sees white spots.  Eye exam was normal

## 2013-03-09 NOTE — Telephone Encounter (Signed)
Pt needs referral to neurology due to visual disturbance.

## 2013-03-10 NOTE — Telephone Encounter (Signed)
Ok per Dr Shawna Orleans for referral.  Referral order placed

## 2013-03-13 ENCOUNTER — Ambulatory Visit (INDEPENDENT_AMBULATORY_CARE_PROVIDER_SITE_OTHER): Payer: 59 | Admitting: Neurology

## 2013-03-13 ENCOUNTER — Encounter: Payer: Self-pay | Admitting: Neurology

## 2013-03-13 VITALS — BP 130/90 | HR 68 | Temp 98.0°F | Resp 20 | Ht 72.5 in | Wt 186.2 lb

## 2013-03-13 DIAGNOSIS — H538 Other visual disturbances: Secondary | ICD-10-CM

## 2013-03-13 NOTE — Progress Notes (Signed)
NEUROLOGY CONSULTATION NOTE  Stephen Bridges MRN: [id number] DOB: 1970/08/24  Referring provider: Dr. Shawna Orleans Primary care provider: Dr. Shawna Orleans  Reason for consult:  Visual disturbances  HISTORY OF PRESENT ILLNESS: Stephen Bridges is a 43 year old right-handed man with history of hyperlipidemia who presents for visual disturbance.  Records and images were personally reviewed where available.    He first noticed symptoms about 6 months ago.  At first, he would notice flickering light in his vision when he would turn off the light at night, lasting 5-10 minutes.  It would also occur when he would open and close his eyes.  One time, it occurred while laying on the bed with the lights on as well.  When his eyes are closed, he sometimes notes a tunnel vision type effect, with a central circle getting larger or smaller. Other times, he would notice a bright central shape that will slowly move to the periphery.  It occurs while looking at the computer screen or his phone.  Sometimes, the floaters have halos around them.  Sometimes when he opens his eyes, he sees a shimmering light.  Also, he notes sometimes seeing small red or blue spots.  These disturbances can be binocular or monocular, involving either eye.  There is no associated eye pain.  He occasionally has a pressure above his right eye, but no throbbing headache, nausea, photophobia, phonophobia or osmophobia.  No dizziness, gait instability or neck pain.  These different brief episodes occur almost daily throughout the day.  He saw an ophthalmologist who told him everything looked okay.  He says he sometimes has pins and needles sensation in the legs that comes and goes.  He says his hands and feet always felt cold easily.  Sometimes he feels a vibration sensation in his right thigh.  His face sometimes feels tight and itchy.  He always had mild tinnitus but more recently he notes fluctuating vibration in his right ear.  He sometimes has numbness in the 4th  and 5th digits of his left hand, but he does lean on his elbow when he drives.  He does say that he is a nervous person and has anxiety.  He is particularly concerned because his mother and his maternal uncle both have MS.  PAST MEDICAL HISTORY: Past Medical History  Diagnosis Date  . Wheezing     exercise induced  . Allergy     allergic rhinitis  . Nosebleed     history of nosebleeds as child    PAST SURGICAL HISTORY: Past Surgical History  Procedure Laterality Date  . Knee arthroscopy  1990    right knee, wrestling injury  . Wisdom tooth extraction  2014    MEDICATIONS: Current Outpatient Prescriptions on File Prior to Visit  Medication Sig Dispense Refill  . fluticasone (FLONASE) 50 MCG/ACT nasal spray Place 2 sprays into the nose daily.  16 g  11   No current facility-administered medications on file prior to visit.    ALLERGIES: Allergies  Allergen Reactions  . Omeprazole Rash    FAMILY HISTORY: Family History  Problem Relation Age of Onset  . Thyroid disease Other   . Cancer Other     prostate  . Prostate cancer Father   . Hypertension Father   . Multiple sclerosis Mother 86  . Multiple sclerosis Maternal Uncle 40    SOCIAL HISTORY: History   Social History  . Marital Status: Married    Spouse Name: N/A    Number  of Children: N/A  . Years of Education: N/A   Occupational History  . Not on file.   Social History Main Topics  . Smoking status: Never Smoker   . Smokeless tobacco: Never Used  . Alcohol Use: Yes     Comment: rare  . Drug Use: No  . Sexual Activity: No   Other Topics Concern  . Not on file   Social History Narrative   Regular exercise:  No   Married--2 sons   Occupation: computers Theatre manager)    REVIEW OF SYSTEMS: Constitutional: No fevers, chills, or sweats, no generalized fatigue, change in appetite Eyes: No visual changes, double vision, eye pain Ear, nose and throat: Ringing in ears, nose  bleed Cardiovascular: No chest pain, palpitations Respiratory:  Chronic cough GastrointestinaI: No nausea, vomiting, diarrhea, abdominal pain, fecal incontinence Genitourinary:  No dysuria, urinary retention or frequency Musculoskeletal:  No neck pain, back pain Integumentary: No rash, pruritus, skin lesions Neurological: as above Psychiatric: No depression, insomnia, anxiety Endocrine: No palpitations, fatigue, diaphoresis, mood swings, change in appetite, change in weight, increased thirst Hematologic/Lymphatic:  No anemia, purpura, petechiae. Allergic/Immunologic: no itchy/runny eyes, nasal congestion, recent allergic reactions, rashes  PHYSICAL EXAM: Filed Vitals:   03/13/13 1051  BP: 130/90  Pulse: 68  Temp: 98 F (36.7 C)  Resp: 20   General: No acute distress Head:  Normocephalic/atraumatic Neck: supple, no paraspinal tenderness, full range of motion Back: No paraspinal tenderness Heart: regular rate and rhythm Lungs: Clear to auscultation bilaterally. Vascular: No carotid bruits. Neurological Exam: Mental status: alert and oriented to person, place, and time, speech fluent and not dysarthric, language intact. Cranial nerves: CN I: not tested CN II: pupils equal, round and reactive to light, visual fields intact, fundi unremarkable. CN III, IV, VI:  full range of motion, no nystagmus, no ptosis CN V: facial sensation intact CN VII: upper and lower face symmetric CN VIII: hearing intact CN IX, X: gag intact, uvula midline CN XI: sternocleidomastoid and trapezius muscles intact CN XII: tongue midline Bulk & Tone: normal, no fasciculations. Motor: 5/5 throughout Sensation: pinprick and vibration intact Deep Tendon Reflexes: 2+ throughout, toes down Finger to nose testing: Mild postural tremulousness in the hands.  No dysmetria. Heel to shin: no dysmetria Gait: normal stance and stride.  Able to turn, walk on toes, heels and in tandem. Romberg  negative.  IMPRESSION: Visual disturbances.  Positive symptoms.  If not retinal, then neurologic etiologies may include migraine or demyelinating disease, although both would be unusual presentations.  Unusual for visual migraine aura in that it occurs multiple times throughout the day almost daily.  In regards to MS, no papilledema or evidence of optic neuropathy appreciated.  Possibly related to anxiety.  PLAN: 1.  Will get MRI of the brain and orbits, with and without contrast.  Further plans pending these results.  45 minutes spent with patient, over 50% spent counseling and coordinating care.  Thank you for allowing me to take part in the care of this patient.  Metta Clines, DO  CC:  Alyson Ingles, MD

## 2013-03-13 NOTE — Patient Instructions (Addendum)
1.  MRI of brain and orbits with and without contrast [facility] 03/25/13 7:45am 2.  Further management pending results

## 2013-03-25 ENCOUNTER — Ambulatory Visit (HOSPITAL_COMMUNITY): Admission: RE | Admit: 2013-03-25 | Payer: 59 | Source: Ambulatory Visit

## 2013-04-09 ENCOUNTER — Ambulatory Visit (HOSPITAL_COMMUNITY): Payer: 59

## 2013-04-10 ENCOUNTER — Ambulatory Visit (HOSPITAL_COMMUNITY)
Admission: RE | Admit: 2013-04-10 | Discharge: 2013-04-10 | Disposition: A | Discharge status: Home / Self Care | Payer: 59 | Source: Ambulatory Visit | Attending: Neurology | Admitting: Neurology

## 2013-04-10 DIAGNOSIS — H539 Unspecified visual disturbance: Secondary | ICD-10-CM | POA: Insufficient documentation

## 2013-04-10 DIAGNOSIS — H538 Other visual disturbances: Secondary | ICD-10-CM

## 2013-04-10 MED ORDER — GADOBENATE DIMEGLUMINE 529 MG/ML IV SOLN
20.0000 mL | Freq: Once | INTRAVENOUS | Status: AC | PRN
  Administered 2013-04-10: 18 mL via INTRAVENOUS

## 2013-04-13 ENCOUNTER — Telehealth: Payer: Self-pay | Admitting: *Deleted

## 2013-04-13 NOTE — Telephone Encounter (Signed)
Patient is aware of normal MRI

## 2013-04-24 ENCOUNTER — Telehealth: Payer: Self-pay | Admitting: Neurology

## 2013-04-24 NOTE — Telephone Encounter (Signed)
Unless there is anything else he would like to discuss, he doesn't need to follow up.  I don't really have an explanation for his symptoms.

## 2013-04-24 NOTE — Telephone Encounter (Signed)
Patient states that he did  Want to follow up because he had questions. Patient placed on schedule for 05-12-13 8am

## 2013-04-24 NOTE — Telephone Encounter (Signed)
Pt called wanting to know if he needs to schedule a f/u with Dr. Tomi Likens since his MRI results are in.  Please confirm with pt.

## 2013-04-24 NOTE — Telephone Encounter (Signed)
Please advise 

## 2013-05-13 ENCOUNTER — Ambulatory Visit (INDEPENDENT_AMBULATORY_CARE_PROVIDER_SITE_OTHER): Payer: 59 | Admitting: Neurology

## 2013-05-13 ENCOUNTER — Encounter: Payer: Self-pay | Admitting: Neurology

## 2013-05-13 VITALS — BP 124/64 | HR 68 | Temp 97.5°F | Resp 18 | Ht 72.0 in | Wt 199.2 lb

## 2013-05-13 DIAGNOSIS — H539 Unspecified visual disturbance: Secondary | ICD-10-CM

## 2013-05-13 NOTE — Patient Instructions (Signed)
Although the symptoms are not classic, it may be a visual migraine without the aura. 1.  Options include starting a preventative migraine medication and seeing if frequency decreases.  Options include propranolol, topiramate, or amitriptyline 2.  To be complete, we will get EEG to look at the brain waves and evidence of possible seizure tendency.  I don't believe these are seizures, but I want to cover all our bases. 3.  Follow up in 4 weeks.

## 2013-05-13 NOTE — Progress Notes (Signed)
NEUROLOGY FOLLOW UP OFFICE NOTE  HENRICK MCGUE [id number]  HISTORY OF PRESENT ILLNESS: Stephen Bridges is a 43 year old right-handed man with history of hyperlipidemia who follows up for visual disturbance.  Records and images were personally reviewed where available.    UPDATE: 04/10/13 MRI HEAD W&WO CONTRAST:  Normal.  Incidental benign cyst noted in medulla.  He went to a retinal specialist who didn't find any problems with the retina.  Some of his floaters may be due to age.  He continues to have visual disturbance, but it seems less frequent.  HISTORY: He first noticed symptoms about 6 months ago.  At first, he would notice flickering light in his vision (both eyes) when he would turn off the light at night, lasting a few minutes.  It would also occur when he would open and close his eyes.  One time, it occurred while laying on the bed with the lights on as well.  When his eyes are closed, he sometimes notes a tunnel vision type effect, with a central circle getting larger or smaller. Other times, he would notice a bright central shape that will slowly move to the periphery.  It occurs while looking at the computer screen or his phone.  Sometimes, the floaters have halos around them.  This can occur with eyes open or close.  It can last 5-10 minutes.  Sometimes when he opens his eyes, he sees a shimmering light.  Also, he notes sometimes seeing small red or blue spots.  These disturbances can be binocular or monocular, involving either eye.  There is no associated eye pain.  He occasionally has a pressure above his right eye, but no throbbing headache, nausea, photophobia, phonophobia or osmophobia.  No dizziness, gait instability or neck pain.  These different brief episodes occur almost daily throughout the day.  He saw an ophthalmologist who told him everything looked okay.  He says he sometimes has pins and needles sensation in the legs that comes and goes.  He says his hands and feet always felt  cold easily.  Sometimes he feels a vibration sensation in his right thigh.  His face sometimes feels tight and itchy.  He always had mild tinnitus but more recently he notes fluctuating vibration in his right ear.  He sometimes has numbness in the 4th and 5th digits of his left hand, but he does lean on his elbow when he drives.  He does say that he is a nervous person and has anxiety.  He is particularly concerned because his mother and his maternal uncle both have MS.  10/24/12 TSH 2.01  PAST MEDICAL HISTORY: Past Medical History  Diagnosis Date  . Wheezing     exercise induced  . Allergy     allergic rhinitis  . Nosebleed     history of nosebleeds as child    MEDICATIONS: Current Outpatient Prescriptions on File Prior to Visit  Medication Sig Dispense Refill  . fluticasone (FLONASE) 50 MCG/ACT nasal spray Place 2 sprays into the nose daily.  16 g  11   No current facility-administered medications on file prior to visit.    ALLERGIES: Allergies  Allergen Reactions  . Omeprazole Rash    FAMILY HISTORY: Family History  Problem Relation Age of Onset  . Thyroid disease Other   . Cancer Other     prostate  . Prostate cancer Father   . Hypertension Father   . Multiple sclerosis Mother 36  . Multiple sclerosis Maternal Uncle  40    SOCIAL HISTORY: History   Social History  . Marital Status: Married    Spouse Name: N/A    Number of Children: N/A  . Years of Education: N/A   Occupational History  . Not on file.   Social History Main Topics  . Smoking status: Never Smoker   . Smokeless tobacco: Never Used  . Alcohol Use: Yes     Comment: rare  . Drug Use: No  . Sexual Activity: No   Other Topics Concern  . Not on file   Social History Narrative   Regular exercise:  No   Married--2 sons   Occupation: computers Theatre manager)    REVIEW OF SYSTEMS: Constitutional: No fevers, chills, or sweats, no generalized fatigue, change in appetite Eyes: No  visual changes, double vision, eye pain Ear, nose and throat: No hearing loss, ear pain, nasal congestion, sore throat Cardiovascular: No chest pain, palpitations Respiratory:  No shortness of breath at rest or with exertion, wheezes GastrointestinaI: No nausea, vomiting, diarrhea, abdominal pain, fecal incontinence Genitourinary:  No dysuria, urinary retention or frequency Musculoskeletal:  No neck pain, back pain Integumentary: No rash, pruritus, skin lesions Neurological: as above Psychiatric: No depression, insomnia, anxiety Endocrine: No palpitations, fatigue, diaphoresis, mood swings, change in appetite, change in weight, increased thirst Hematologic/Lymphatic:  No anemia, purpura, petechiae. Allergic/Immunologic: no itchy/runny eyes, nasal congestion, recent allergic reactions, rashes  PHYSICAL EXAM: Filed Vitals:   05/13/13 0759  BP: 124/64  Pulse: 68  Temp: 97.5 F (36.4 C)  Resp: 18   General: No acute distress.  Anxious Head:  Normocephalic/atraumatic   IMPRESSION: Visual disturbances.  Visual migraine without headache cannot be ruled out. Unusual, though, to all of a sudden develop multiple presentations of different visual auras occuring so frequently. MRI did not reveal demyelinating disease.    PLAN: 1.  Suggested we can try a trial of a migraine preventative.  We discussed options and side effects such as propranolol, topiramate, and amitriptyline or nortriptyline.  We can see if frequency decreases.  He wishes to think about this first before initiating.  In the meantime, he will monitor and record how often and what type of visual episodes he experiences over the next month. 2.  To be complete, we will get a routine EEG to look for evidence of increased seizure tendency in the occipital region. 3.  Follow up in 4 weeks to discuss next steps.  30 minutes spent with the patient, 100% spent reviewing the MRI, discussing possible differential diagnoses, counseling,  and coordinating care.  Metta Clines, DO  CC:  Alyson Ingles, MD

## 2013-05-21 ENCOUNTER — Other Ambulatory Visit: Payer: 59

## 2013-06-04 ENCOUNTER — Other Ambulatory Visit: Payer: 59

## 2013-06-12 ENCOUNTER — Ambulatory Visit: Payer: 59 | Admitting: Neurology

## 2013-06-23 ENCOUNTER — Telehealth: Payer: Self-pay | Admitting: Neurology

## 2013-06-23 NOTE — Telephone Encounter (Signed)
Pt cancelled EEG and follow up d/t scheduling conflicts. He will call back later to r/s / Sherri S.

## 2013-06-25 ENCOUNTER — Other Ambulatory Visit: Payer: 59

## 2013-07-06 ENCOUNTER — Ambulatory Visit: Payer: 59 | Admitting: Neurology

## 2013-10-21 ENCOUNTER — Ambulatory Visit (INDEPENDENT_AMBULATORY_CARE_PROVIDER_SITE_OTHER): Payer: 59

## 2013-10-21 DIAGNOSIS — Z23 Encounter for immunization: Secondary | ICD-10-CM

## 2013-10-30 ENCOUNTER — Other Ambulatory Visit (INDEPENDENT_AMBULATORY_CARE_PROVIDER_SITE_OTHER): Payer: 59

## 2013-10-30 DIAGNOSIS — Z0000 Encounter for general adult medical examination without abnormal findings: Secondary | ICD-10-CM

## 2013-10-30 DIAGNOSIS — Z125 Encounter for screening for malignant neoplasm of prostate: Secondary | ICD-10-CM

## 2013-10-30 LAB — CBC WITH DIFFERENTIAL/PLATELET
Basophils Absolute: 0 10*3/uL (ref 0.0–0.1)
Basophils Relative: 0.4 % (ref 0.0–3.0)
Eosinophils Absolute: 0 10*3/uL (ref 0.0–0.7)
Eosinophils Relative: 0.6 % (ref 0.0–5.0)
HCT: 46.4 % (ref 39.0–52.0)
HEMOGLOBIN: 15.3 g/dL (ref 13.0–17.0)
LYMPHS ABS: 1.5 10*3/uL (ref 0.7–4.0)
Lymphocytes Relative: 28.7 % (ref 12.0–46.0)
MCHC: 33 g/dL (ref 30.0–36.0)
MCV: 88.9 fl (ref 78.0–100.0)
MONO ABS: 0.3 10*3/uL (ref 0.1–1.0)
Monocytes Relative: 4.9 % (ref 3.0–12.0)
NEUTROS ABS: 3.5 10*3/uL (ref 1.4–7.7)
Neutrophils Relative %: 65.4 % (ref 43.0–77.0)
PLATELETS: 232 10*3/uL (ref 150.0–400.0)
RBC: 5.22 Mil/uL (ref 4.22–5.81)
RDW: 13.6 % (ref 11.5–15.5)
WBC: 5.4 10*3/uL (ref 4.0–10.5)

## 2013-10-30 LAB — POCT URINALYSIS DIPSTICK
Bilirubin, UA: NEGATIVE
GLUCOSE UA: NEGATIVE
Ketones, UA: NEGATIVE
LEUKOCYTES UA: NEGATIVE
NITRITE UA: NEGATIVE
Protein, UA: NEGATIVE
RBC UA: NEGATIVE
Spec Grav, UA: 1.015
Urobilinogen, UA: 0.2
pH, UA: 7.5

## 2013-10-30 LAB — BASIC METABOLIC PANEL
BUN: 15 mg/dL (ref 6–23)
CALCIUM: 9.6 mg/dL (ref 8.4–10.5)
CHLORIDE: 104 meq/L (ref 96–112)
CO2: 20 meq/L (ref 19–32)
Creatinine, Ser: 1.1 mg/dL (ref 0.4–1.5)
GFR: 81.71 mL/min (ref >60.00)
Glucose, Bld: 87 mg/dL (ref 70–99)
POTASSIUM: 4.2 meq/L (ref 3.5–5.1)
Sodium: 140 mEq/L (ref 135–145)

## 2013-10-30 LAB — LIPID PANEL
Cholesterol: 202 mg/dL — ABNORMAL HIGH (ref 0–200)
HDL: 35.9 mg/dL — ABNORMAL LOW (ref >39.00)
LDL Cholesterol: 143 mg/dL — ABNORMAL HIGH (ref 0–99)
NonHDL: 166.1
TRIGLYCERIDES: 116 mg/dL (ref 0.0–149.0)
Total CHOL/HDL Ratio: 6
VLDL: 23.2 mg/dL (ref 0.0–40.0)

## 2013-10-30 LAB — HEPATIC FUNCTION PANEL
ALT: 26 U/L (ref 0–53)
AST: 28 U/L (ref 0–37)
Albumin: 4 g/dL (ref 3.5–5.2)
Alkaline Phosphatase: 56 U/L (ref 39–117)
Bilirubin, Direct: 0.1 mg/dL (ref 0.0–0.3)
Total Bilirubin: 1.1 mg/dL (ref 0.2–1.2)
Total Protein: 7.3 g/dL (ref 6.0–8.3)

## 2013-10-30 LAB — TSH: TSH: 2.11 u[IU]/mL (ref 0.35–4.50)

## 2013-10-30 LAB — PSA: PSA: 1.66 ng/mL (ref 0.10–4.00)

## 2013-11-06 ENCOUNTER — Encounter: Payer: Self-pay | Admitting: Internal Medicine

## 2013-11-06 ENCOUNTER — Ambulatory Visit (INDEPENDENT_AMBULATORY_CARE_PROVIDER_SITE_OTHER): Payer: 59 | Admitting: Internal Medicine

## 2013-11-06 VITALS — BP 122/82 | HR 76 | Temp 98.7°F | Ht 72.0 in | Wt 201.0 lb

## 2013-11-06 DIAGNOSIS — Z0000 Encounter for general adult medical examination without abnormal findings: Secondary | ICD-10-CM

## 2013-11-06 NOTE — Progress Notes (Signed)
Pre visit review using our clinic review tool, if applicable. No additional management support is needed unless otherwise documented below in the visit note. 

## 2013-11-06 NOTE — Progress Notes (Signed)
Subjective:    Patient ID: Stephen Bridges, male    DOB: Jan 08, 1971, 42 y.o.   MRN: [id number]  HPI  43 year old white male with history of intermittent cough and allergic rhinitis for routine physical. Interval medical history-patient was evaluated by ophthalmologist and neurologist for visual changes. Patient describes seeing flashes of light sometimes in his right eye and sometimes in his left. His MRI of brain was unremarkable. His neurologist and ophthalmologist have suggested patient may have retinal migraine. He has been referred to neuro-ophthalmologist at [facility]. Neurologist recommended trial of Topamax but patient has not started medication yet.  Patient's visual changes seem to worsen with increased stress. He reports his job is stressful. Patient also had a break in his home recently that is adding to his stress levels.  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  Social and family history reviewed and updated.  Review of Systems   Constitutional: Negative for activity change, appetite change and unexpected weight change.  Eyes: positive for visual disturbance.  Respiratory: Negative for cough, chest tightness and shortness of breath.   Cardiovascular: Negative for chest pain.  Genitourinary: Negative for difficulty urinating.  Neurological: Negative for headaches.  Gastrointestinal: Negative for abdominal pain, heartburn melena or hematochezia Psych: he tends to worry, possible anxiety problem Endo:  No sexual dysfunction.        Past Medical History  Diagnosis Date  . Wheezing     exercise induced  . Allergy     allergic rhinitis  . Nosebleed     history of nosebleeds as child    History   Social History  . Marital Status: Married    Spouse Name: N/A    Number of Children: N/A  . Years of Education: N/A   Occupational History  . Not on file.   Social History Main Topics  . Smoking status:  Never Smoker   . Smokeless tobacco: Never Used  . Alcohol Use: Yes     Comment: rare  . Drug Use: No  . Sexual Activity: No   Other Topics Concern  . Not on file   Social History Narrative   Regular exercise:  No   Married--2 sons   Occupation: computers (Forensic psychologist)    Past Surgical History  Procedure Laterality Date  . Knee arthroscopy  1990    right knee, wrestling injury  . Wisdom tooth extraction  2014    Family History  Problem Relation Age of Onset  . Thyroid disease Other   . Cancer Other     prostate  . Prostate cancer Father   . Hypertension Father   . Multiple sclerosis Mother 86  . Multiple sclerosis Maternal Uncle 40    Allergies  Allergen Reactions  . Omeprazole Rash    No current outpatient prescriptions on file prior to visit.   No current facility-administered medications on file prior to visit.    BP 122/82  Pulse 76  Temp(Src) 98.7 F (37.1 C) (Oral)  Ht 6' (1.829 m)  Wt 201 lb (91.173 kg)  BMI 27.25 kg/m2    Objective:   Physical Exam  Constitutional: He is oriented to person, place, and time. He appears well-developed and well-nourished. No distress.  HENT:  Head: Normocephalic and atraumatic.  Right Ear: External ear normal.  Left Ear: External ear normal.  Mouth/Throat: Oropharynx is clear and moist.  Eyes: Conjunctivae and EOM are normal. Pupils are equal, round, and reactive  to light. No scleral icterus.  Neck: Normal range of motion. Neck supple.  Cardiovascular: Normal rate, regular rhythm and normal heart sounds.   No murmur heard. Pulmonary/Chest: Effort normal. He has no wheezes.  Abdominal: Soft. Bowel sounds are normal. There is no tenderness.  Genitourinary: Rectum normal, prostate normal and penis normal. Guaiac negative stool.  Musculoskeletal: He exhibits no edema.  Lymphadenopathy:    He has no cervical adenopathy.  Neurological: He is alert and oriented to person, place, and time. No cranial nerve  deficit.  Skin: Skin is warm.  Psychiatric: He has a normal mood and affect. His behavior is normal.          Assessment & Plan:

## 2013-11-06 NOTE — Assessment & Plan Note (Signed)
Reviewed adult health maintenance protocols.  Screening for prostate cancer negative.  Patient already received flu vaccine.  Patient to consider using low dose SSRI for GAD.

## 2013-11-06 NOTE — Patient Instructions (Signed)
Start regular exercise program Decrease your intake of saturated fats Try to lose 10-15 lbs Contact our office if you want to start SSRI

## 2014-05-16 IMAGING — CR DG CHEST 2V
3 series · 3 of 3 positions shown · non-contrast
Comparison: 04/28/2007

CLINICAL DATA: Intermittent shortness of breath, cough, nonsmoker

CHEST - 2 VIEW

[view not recorded (1 of 3)]
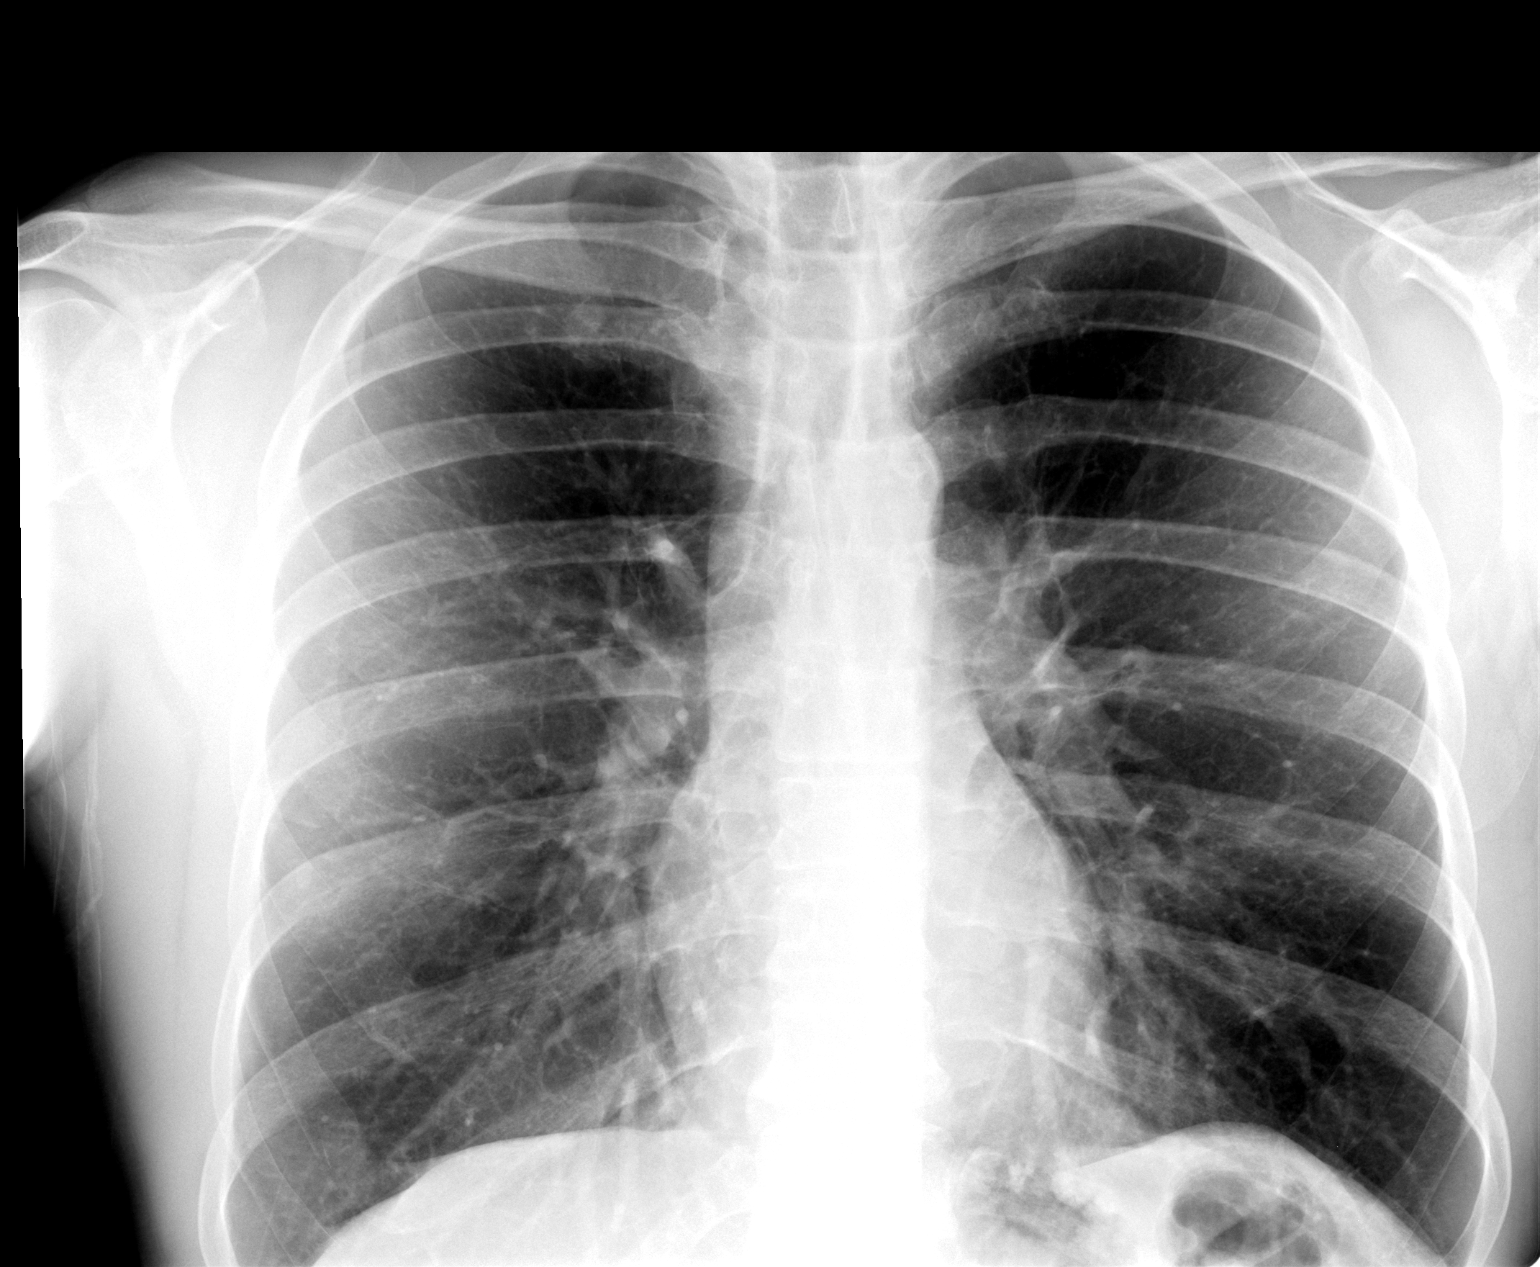

[view not recorded (2 of 3)]
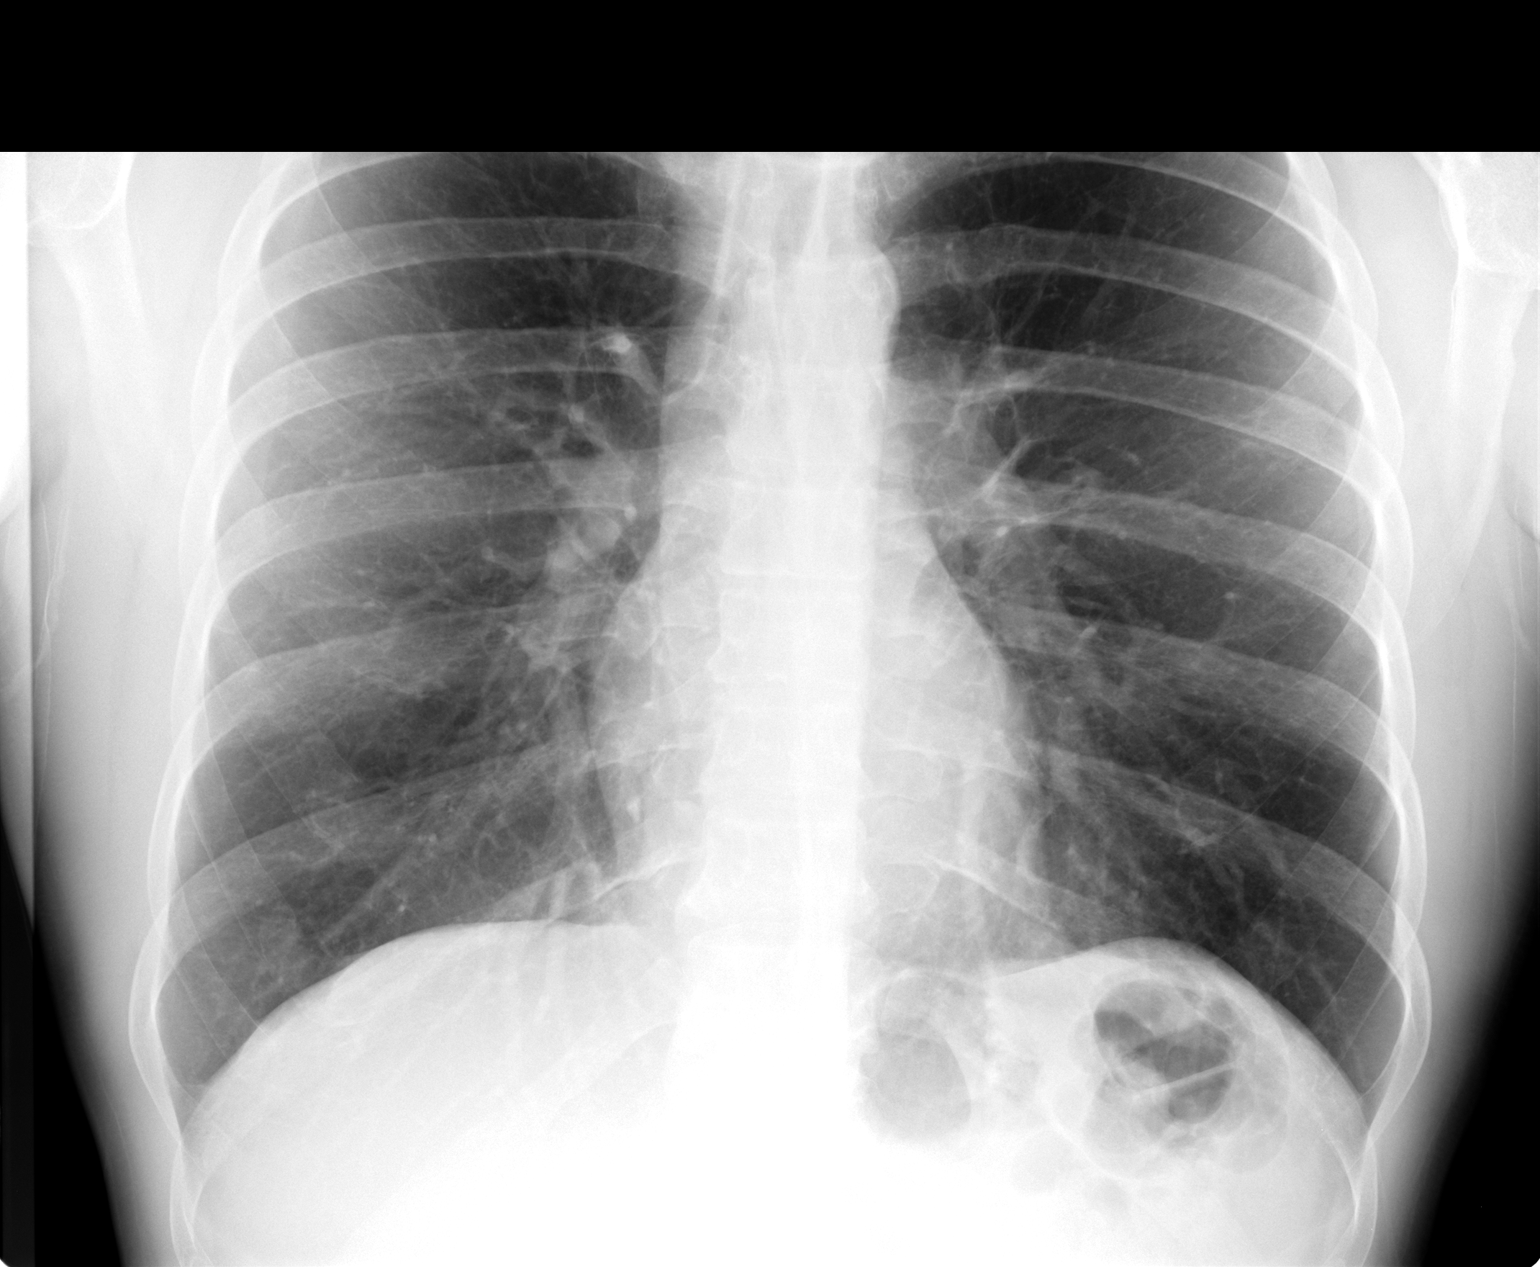

[view not recorded (3 of 3)]
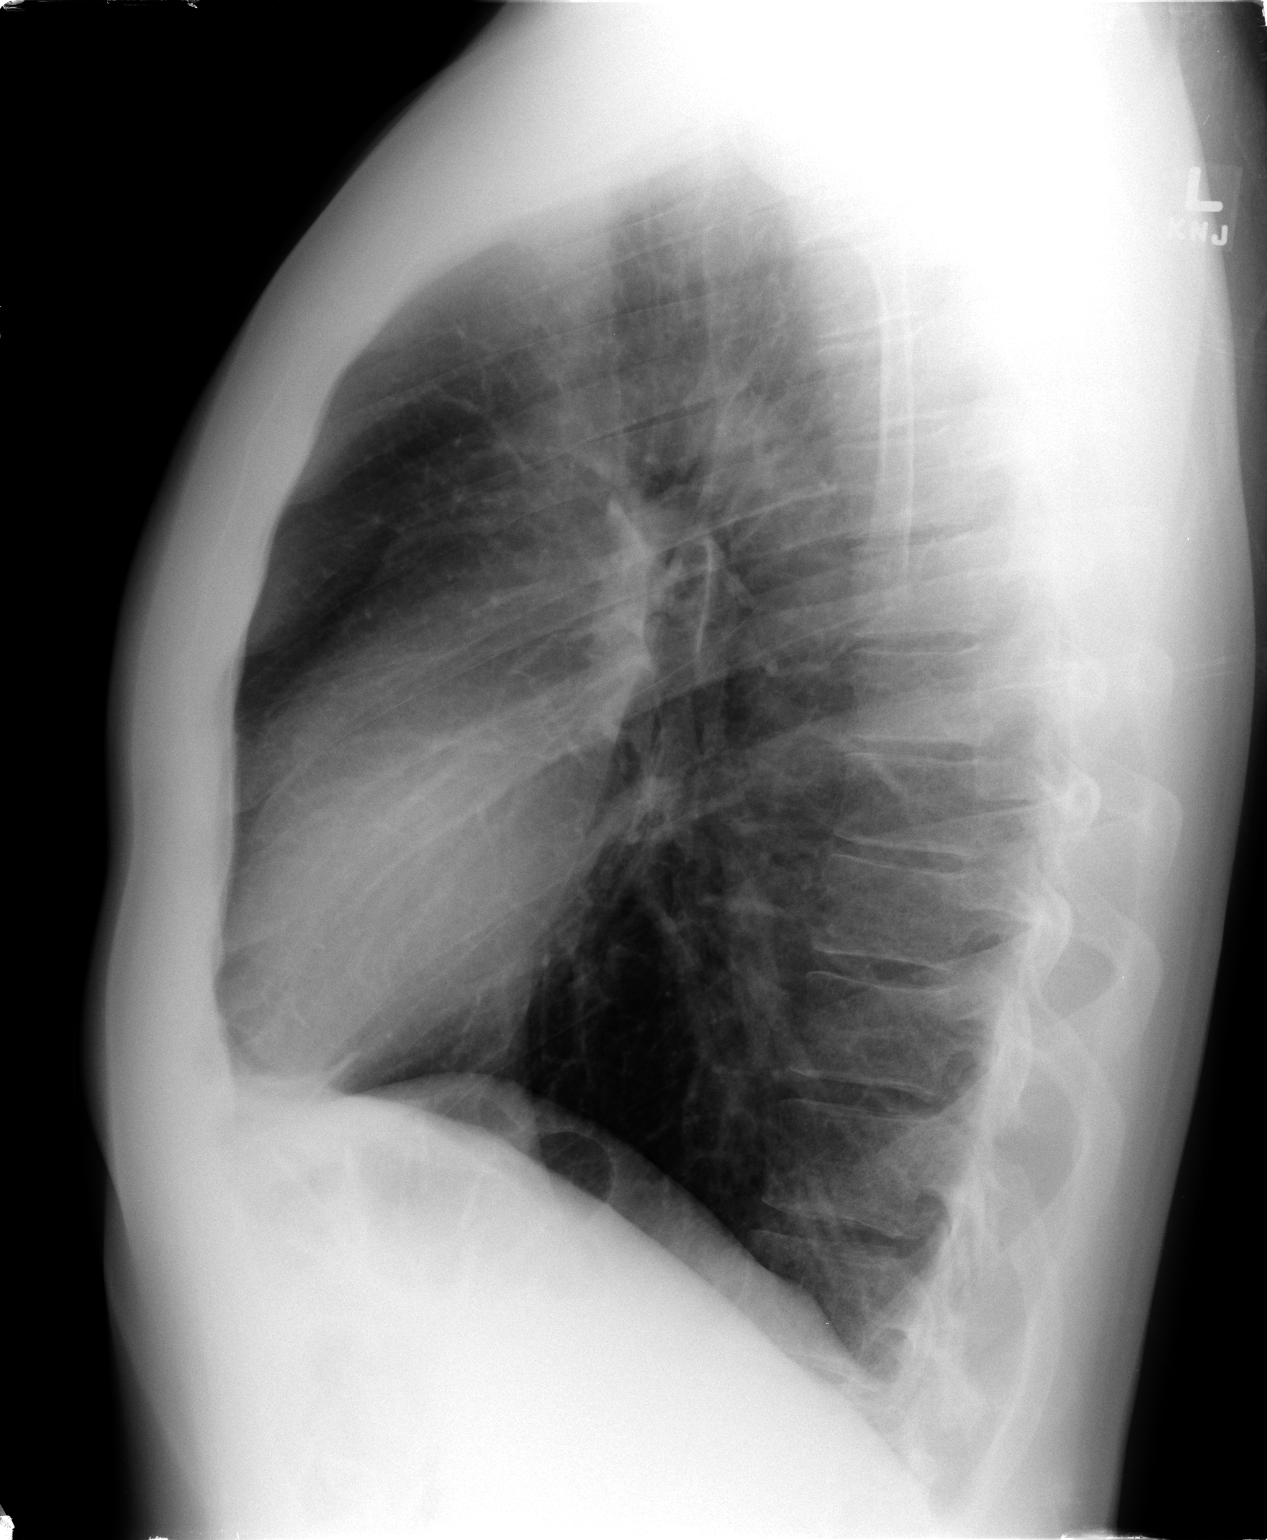

[3 of 3 positions shown; findings below may reference images not displayed]

FINDINGS: Cardiomediastinal silhouette is stable.  No acute
infiltrate or pleural effusion.  No pulmonary edema.  Bony thorax
is unremarkable.
IMPRESSION: No active disease.  No significant change.

## 2014-10-22 ENCOUNTER — Other Ambulatory Visit (INDEPENDENT_AMBULATORY_CARE_PROVIDER_SITE_OTHER): Payer: BLUE CROSS/BLUE SHIELD

## 2014-10-22 DIAGNOSIS — Z0000 Encounter for general adult medical examination without abnormal findings: Secondary | ICD-10-CM | POA: Diagnosis not present

## 2014-10-22 LAB — CBC WITH DIFFERENTIAL/PLATELET
BASOS ABS: 0 10*3/uL (ref 0.0–0.1)
Basophils Relative: 0.4 % (ref 0.0–3.0)
EOS PCT: 0.6 % (ref 0.0–5.0)
Eosinophils Absolute: 0 10*3/uL (ref 0.0–0.7)
HEMATOCRIT: 43.8 % (ref 39.0–52.0)
Hemoglobin: 14.7 g/dL (ref 13.0–17.0)
LYMPHS PCT: 26.9 % (ref 12.0–46.0)
Lymphs Abs: 1.2 10*3/uL (ref 0.7–4.0)
MCHC: 33.6 g/dL (ref 30.0–36.0)
MCV: 90.3 fl (ref 78.0–100.0)
MONOS PCT: 6.7 % (ref 3.0–12.0)
Monocytes Absolute: 0.3 10*3/uL (ref 0.1–1.0)
Neutro Abs: 3 10*3/uL (ref 1.4–7.7)
Neutrophils Relative %: 65.4 % (ref 43.0–77.0)
Platelets: 224 10*3/uL (ref 150.0–400.0)
RBC: 4.86 Mil/uL (ref 4.22–5.81)
RDW: 13.5 % (ref 11.5–15.5)
WBC: 4.6 10*3/uL (ref 4.0–10.5)

## 2014-10-22 LAB — LIPID PANEL
CHOL/HDL RATIO: 5
Cholesterol: 212 mg/dL — ABNORMAL HIGH (ref 0–200)
HDL: 46.5 mg/dL (ref >39.00)
LDL CALC: 151 mg/dL — AB (ref 0–99)
NonHDL: 165.65
TRIGLYCERIDES: 71 mg/dL (ref 0.0–149.0)
VLDL: 14.2 mg/dL (ref 0.0–40.0)

## 2014-10-22 LAB — POCT URINALYSIS DIPSTICK
BILIRUBIN UA: NEGATIVE
GLUCOSE UA: NEGATIVE
KETONES UA: NEGATIVE
Leukocytes, UA: NEGATIVE
Nitrite, UA: NEGATIVE
Protein, UA: NEGATIVE
RBC UA: NEGATIVE
SPEC GRAV UA: 1.015
Urobilinogen, UA: 0.2
pH, UA: 6.5

## 2014-10-22 LAB — BASIC METABOLIC PANEL
BUN: 18 mg/dL (ref 6–23)
CALCIUM: 9.3 mg/dL (ref 8.4–10.5)
CO2: 27 mEq/L (ref 19–32)
Chloride: 103 mEq/L (ref 96–112)
Creatinine, Ser: 0.96 mg/dL (ref 0.40–1.50)
GFR: 90.21 mL/min (ref >60.00)
GLUCOSE: 109 mg/dL — AB (ref 70–99)
POTASSIUM: 4.3 meq/L (ref 3.5–5.1)
Sodium: 139 mEq/L (ref 135–145)

## 2014-10-22 LAB — HEPATIC FUNCTION PANEL
ALBUMIN: 4.4 g/dL (ref 3.5–5.2)
ALK PHOS: 54 U/L (ref 39–117)
ALT: 25 U/L (ref 0–53)
AST: 27 U/L (ref 0–37)
Bilirubin, Direct: 0.1 mg/dL (ref 0.0–0.3)
TOTAL PROTEIN: 7.1 g/dL (ref 6.0–8.3)
Total Bilirubin: 0.8 mg/dL (ref 0.2–1.2)

## 2014-10-22 LAB — PSA: PSA: 1.85 ng/mL (ref 0.10–4.00)

## 2014-10-22 LAB — TSH: TSH: 2.16 u[IU]/mL (ref 0.35–4.50)

## 2014-10-27 ENCOUNTER — Ambulatory Visit (INDEPENDENT_AMBULATORY_CARE_PROVIDER_SITE_OTHER): Payer: BLUE CROSS/BLUE SHIELD | Admitting: Adult Health

## 2014-10-27 ENCOUNTER — Encounter: Payer: Self-pay | Admitting: Adult Health

## 2014-10-27 ENCOUNTER — Other Ambulatory Visit (INDEPENDENT_AMBULATORY_CARE_PROVIDER_SITE_OTHER): Payer: BLUE CROSS/BLUE SHIELD

## 2014-10-27 VITALS — BP 100/68 | HR 70 | Temp 98.5°F | Ht 72.0 in | Wt 206.9 lb

## 2014-10-27 DIAGNOSIS — R739 Hyperglycemia, unspecified: Secondary | ICD-10-CM | POA: Diagnosis not present

## 2014-10-27 DIAGNOSIS — H6593 Unspecified nonsuppurative otitis media, bilateral: Secondary | ICD-10-CM | POA: Diagnosis not present

## 2014-10-27 LAB — HEMOGLOBIN A1C: Hgb A1c MFr Bld: 5.6 % (ref 4.6–6.5)

## 2014-10-27 MED ORDER — AMOXICILLIN 500 MG PO CAPS: 500.0000 mg | ORAL_CAPSULE | Freq: Two times a day (BID) | ORAL | Status: DC

## 2014-10-27 NOTE — Progress Notes (Signed)
Pre visit review using our clinic review tool, if applicable. No additional management support is needed unless otherwise documented below in the visit note. 

## 2014-10-27 NOTE — Progress Notes (Signed)
Subjective:    Patient ID: Stephen Bridges, male    DOB: 01/17/1970, 44 y.o.   MRN: [id number]  HPI  44 year old male, patient of Dr. Lora Havens, who presents to the office today for bilateral ear discomfort. Worse at night and when he wakes up in the morning. The ear discomfort becomes better in the afternoon. Denies any discharge from the ears. Has not been using anything over the counter.   Denies fever or sinus congestion.   He is going to [place] tomorrow for 8 days.   Review of Systems  Constitutional: Negative.   HENT: Positive for ear pain. Negative for congestion, ear discharge, postnasal drip, rhinorrhea and sinus pressure.   Eyes: Negative.   Respiratory: Positive for cough (dry, chronic). Negative for choking, chest tightness and wheezing.   Cardiovascular: Negative.   Psychiatric/Behavioral: Negative.   All other systems reviewed and are negative.  Past Medical History  Diagnosis Date  . Wheezing     exercise induced  . Allergy     allergic rhinitis  . Nosebleed     history of nosebleeds as child    Social History   Social History  . Marital Status: Married    Spouse Name: N/A  . Number of Children: N/A  . Years of Education: N/A   Occupational History  . Not on file.   Social History Main Topics  . Smoking status: Never Smoker   . Smokeless tobacco: Never Used  . Alcohol Use: Yes     Comment: rare  . Drug Use: No  . Sexual Activity: No   Other Topics Concern  . Not on file   Social History Narrative   Regular exercise:  No   Married--2 sons   Occupation: computers (Forensic psychologist)    Past Surgical History  Procedure Laterality Date  . Knee arthroscopy  1990    right knee, wrestling injury  . Wisdom tooth extraction  2014    Family History  Problem Relation Age of Onset  . Thyroid disease Other   . Cancer Other     prostate  . Prostate cancer Father   . Hypertension Father   . Multiple sclerosis Mother 44  . Multiple sclerosis  Maternal Uncle 40    Allergies  Allergen Reactions  . Omeprazole Rash    Current Outpatient Prescriptions on File Prior to Visit  Medication Sig Dispense Refill  . Multiple Vitamin (MULTIVITAMIN) tablet Take 1 tablet by mouth daily.    . Omega-3 Fatty Acids (FISH OIL) 1000 MG CAPS Take 1 capsule by mouth daily.     No current facility-administered medications on file prior to visit.    BP 100/68 mmHg  Pulse 70  Temp(Src) 98.5 F (36.9 C) (Oral)  Ht 6' (1.829 m)  Wt 206 lb 14.4 oz (93.849 kg)  BMI 28.05 kg/m2  SpO2 95%       Objective:   Physical Exam  Constitutional: He is oriented to person, place, and time. He appears well-developed and well-nourished. No distress.  HENT:  Head: Normocephalic and atraumatic.  Right Ear: External ear normal.  Left Ear: External ear normal.  Nose: Nose normal.  Mouth/Throat: Oropharynx is clear and moist. No oropharyngeal exudate.  Slight redness and effusion in bilateral ears  Eyes: Conjunctivae and EOM are normal. Pupils are equal, round, and reactive to light. Right eye exhibits no discharge. Left eye exhibits no discharge. No scleral icterus.  Neck: Normal range of motion. Neck supple.  Cardiovascular:  Normal rate, regular rhythm, normal heart sounds and intact distal pulses.  Exam reveals no gallop and no friction rub.   No murmur heard. Pulmonary/Chest: Effort normal and breath sounds normal. No respiratory distress. He has no wheezes. He has no rales. He exhibits no tenderness.  Lymphadenopathy:    He has no cervical adenopathy.  Neurological: He is alert and oriented to person, place, and time.  Skin: Skin is warm and dry. No rash noted. He is not diaphoretic. No erythema. No pallor.  Psychiatric: He has a normal mood and affect. His behavior is normal. Judgment and thought content normal.  Nursing note and vitals reviewed.      Assessment & Plan:  1. Otitis media with effusion, bilateral - amoxicillin (AMOXIL) 500 MG  capsule; Take 1 capsule (500 mg total) by mouth 2 (two) times daily.  Dispense: 20 capsule; Refill: 0 - Use pseudoephedrine before travel to help with ear discomfort.  - Can also use Flonase.

## 2014-10-27 NOTE — Patient Instructions (Addendum)
It was great meeting you today  I have sent in a prescription for Amoxicillin, take this twice a day for 10 days.   Use a decongestant for air travel to help get rid of the fluid behind your ears.   If you need anything,please let me know.   Otitis Media With Effusion Otitis media with effusion is the presence of fluid in the middle ear. This is a common problem in children, which often follows ear infections. It may be present for weeks or longer after the infection. Unlike an acute ear infection, otitis media with effusion refers only to fluid behind the ear drum and not infection. Children with repeated ear and sinus infections and allergy problems are the most likely to get otitis media with effusion. CAUSES  The most frequent cause of the fluid buildup is dysfunction of the eustachian tubes. These are the tubes that drain fluid in the ears to the back of the nose (nasopharynx). SYMPTOMS   The main symptom of this condition is hearing loss. As a result, you or your child may:  Listen to the TV at a loud volume.  Not respond to questions.  Ask "what" often when spoken to.  Mistake or confuse one sound or word for another.  There may be a sensation of fullness or pressure but usually not pain. DIAGNOSIS   Your health care provider will diagnose this condition by examining you or your child's ears.  Your health care provider may test the pressure in you or your child's ear with a tympanometer.  A hearing test may be conducted if the problem persists. TREATMENT   Treatment depends on the duration and the effects of the effusion.  Antibiotics, decongestants, nose drops, and cortisone-type drugs (tablets or nasal spray) may not be helpful.  Children with persistent ear effusions may have delayed language or behavioral problems. Children at risk for developmental delays in hearing, learning, and speech may require referral to a specialist earlier than children not at risk.  You  or your child's health care provider may suggest a referral to an ear, nose, and throat surgeon for treatment. The following may help restore normal hearing:  Drainage of fluid.  Placement of ear tubes (tympanostomy tubes).  Removal of adenoids (adenoidectomy). HOME CARE INSTRUCTIONS   Avoid secondhand smoke.  Infants who are breastfed are less likely to have this condition.  Avoid feeding infants while they are lying flat.  Avoid known environmental allergens.  Avoid people who are sick. SEEK MEDICAL CARE IF:   Hearing is not better in 3 months.  Hearing is worse.  Ear pain.  Drainage from the ear.  Dizziness. MAKE SURE YOU:   Understand these instructions.  Will watch your condition.  Will get help right away if you are not doing well or get worse.   This information is not intended to replace advice given to you by your health care provider. Make sure you discuss any questions you have with your health care provider.   Document Released: 02/02/2004 Document Revised: 01/15/2014 Document Reviewed: 07/22/2012 Elsevier Interactive Patient Education [employer].

## 2014-11-03 ENCOUNTER — Other Ambulatory Visit: Payer: 59

## 2014-11-10 ENCOUNTER — Encounter: Payer: 59 | Admitting: Internal Medicine

## 2014-11-19 ENCOUNTER — Encounter: Payer: Self-pay | Admitting: Adult Health

## 2014-11-19 ENCOUNTER — Ambulatory Visit (INDEPENDENT_AMBULATORY_CARE_PROVIDER_SITE_OTHER): Payer: BLUE CROSS/BLUE SHIELD | Admitting: Adult Health

## 2014-11-19 VITALS — BP 104/80 | Temp 98.6°F | Ht 72.0 in | Wt 205.2 lb

## 2014-11-19 DIAGNOSIS — L309 Dermatitis, unspecified: Secondary | ICD-10-CM | POA: Diagnosis not present

## 2014-11-19 DIAGNOSIS — Z0000 Encounter for general adult medical examination without abnormal findings: Secondary | ICD-10-CM

## 2014-11-19 DIAGNOSIS — Z23 Encounter for immunization: Secondary | ICD-10-CM

## 2014-11-19 MED ORDER — METHYLPREDNISOLONE 4 MG PO TBPK: ORAL_TABLET | ORAL | Status: DC

## 2014-11-19 NOTE — Progress Notes (Signed)
Subjective:    Patient ID: Stephen Bridges, male    DOB: Nov 19, 1970, 44 y.o.   MRN: [id number]  HPI  Patient presents for yearly preventative medicine examination.He  has a past medical history of Wheezing; Allergy; and Nosebleed.  All immunizations and health maintenance protocols were reviewed with the patient and needed orders were placed.  Appropriate screening laboratory values were reviewed with the patient including screening of hyperlipidemia, renal function and hepatic function. If indicated by BPH, a PSA was ordered.  Medication reconciliation,  past medical history, social history, problem list and allergies were reviewed in detail with the patient  Goals were established with regard to weight loss, exercise, and  diet in compliance with medications   Rash His only complaint is that of a red itchy rash that resembles hives after he gets out of the shower. This has been an ongoing issue for the last 2 weeks. Has not changed detergents or shampoos (uses baby shampoo). It happens at home and at the gym. He does not have city water at his home. The rash lasts for about 10-20 minutes and then resolves. This has never happened in the past. He denies any SOB or feeling like his throat is closing.   Review of Systems  Constitutional: Negative.   HENT: Negative.   Eyes: Negative.   Respiratory: Negative.   Cardiovascular: Negative.   Gastrointestinal: Negative.   Endocrine: Negative.   Genitourinary: Negative.   Musculoskeletal: Negative.   Skin: Positive for rash.  Allergic/Immunologic: Negative.   Neurological: Negative.   Hematological: Negative.   Psychiatric/Behavioral: Negative.   All other systems reviewed and are negative.  Past Medical History  Diagnosis Date  . Wheezing     exercise induced  . Allergy     allergic rhinitis  . Nosebleed     history of nosebleeds as child    Social History   Social History  . Marital Status: Married    Spouse Name: N/A  .  Number of Children: N/A  . Years of Education: N/A   Occupational History  . Not on file.   Social History Main Topics  . Smoking status: Never Smoker   . Smokeless tobacco: Never Used  . Alcohol Use: Yes     Comment: rare  . Drug Use: No  . Sexual Activity: No   Other Topics Concern  . Not on file   Social History Narrative   Regular exercise:  No   Married--2 sons   Occupation: computers (Forensic psychologist)    Past Surgical History  Procedure Laterality Date  . Knee arthroscopy  1990    right knee, wrestling injury  . Wisdom tooth extraction  2014    Family History  Problem Relation Age of Onset  . Thyroid disease Other   . Cancer Other     prostate  . Prostate cancer Father   . Hypertension Father   . Multiple sclerosis Mother 52  . Multiple sclerosis Maternal Uncle 40    Allergies  Allergen Reactions  . Omeprazole Rash    Current Outpatient Prescriptions on File Prior to Visit  Medication Sig Dispense Refill  . Multiple Vitamin (MULTIVITAMIN) tablet Take 1 tablet by mouth daily.    . Omega-3 Fatty Acids (FISH OIL) 1000 MG CAPS Take 1 capsule by mouth daily.     No current facility-administered medications on file prior to visit.    BP 104/80 mmHg  Temp(Src) 98.6 F (37 C) (Oral)  Ht 6' (1.829 m)  Wt 205 lb 3.2 oz (93.078 kg)  BMI 27.82 kg/m2       Objective:   Physical Exam  Constitutional: He is oriented to person, place, and time. He appears well-developed and well-nourished. No distress.  HENT:  Head: Normocephalic and atraumatic.  Right Ear: External ear normal.  Left Ear: External ear normal.  Nose: Nose normal.  Mouth/Throat: Oropharynx is clear and moist. No oropharyngeal exudate.  Eyes: Conjunctivae and EOM are normal. Pupils are equal, round, and reactive to light. Right eye exhibits no discharge. Left eye exhibits no discharge. No scleral icterus.  Neck: Normal range of motion. Neck supple. No JVD present. No tracheal  deviation present. No thyromegaly present.  Cardiovascular: Normal rate, regular rhythm, normal heart sounds and intact distal pulses.  Exam reveals no gallop and no friction rub.   No murmur heard. Pulmonary/Chest: Effort normal and breath sounds normal. No respiratory distress. He has no wheezes. He has no rales. He exhibits no tenderness.  Abdominal: Soft. Bowel sounds are normal. He exhibits no distension and no mass. There is no tenderness. There is no rebound and no guarding.  Genitourinary: Rectum normal, prostate normal and penis normal. Guaiac negative stool. No penile tenderness.  Musculoskeletal: Normal range of motion. He exhibits no edema or tenderness.  Lymphadenopathy:    He has no cervical adenopathy.  Neurological: He is alert and oriented to person, place, and time.  Skin: Skin is warm and dry. No rash noted. He is not diaphoretic. No erythema. No pallor.  No rash noticed. Pictures he has taken show large red areas scattered throughout torso and on face and arms  Psychiatric: He has a normal mood and affect. His behavior is normal. Judgment and thought content normal.  Nursing note and vitals reviewed.     Assessment & Plan:   1. Dermatitis - Unsure of what is causing his rash. Likely contact dermatitis. Differentials include Aquagenic Pruritus, dry skin, allergic reaction - methylPREDNISolone (MEDROL DOSEPAK) 4 MG TBPK tablet; Take as directed  Dispense: 21 tablet; Refill: 0 - Consider referral to Dermatology - Moisturize as soon as getting out of the shower 2. Encounter for immunization - Flu vaccination given   3. Preventative health care - Reviewed labs - Stressed the importance of diet and exercise.  -

## 2014-11-19 NOTE — Patient Instructions (Signed)
It was great seeing you again today! 

## 2015-03-17 ENCOUNTER — Ambulatory Visit (INDEPENDENT_AMBULATORY_CARE_PROVIDER_SITE_OTHER): Payer: BLUE CROSS/BLUE SHIELD | Admitting: Family Medicine

## 2015-03-17 ENCOUNTER — Ambulatory Visit (INDEPENDENT_AMBULATORY_CARE_PROVIDER_SITE_OTHER)
Admission: RE | Admit: 2015-03-17 | Discharge: 2015-03-17 | Disposition: A | Discharge status: Home / Self Care | Payer: BLUE CROSS/BLUE SHIELD | Source: Ambulatory Visit | Attending: Family Medicine | Admitting: Family Medicine

## 2015-03-17 ENCOUNTER — Encounter: Payer: Self-pay | Admitting: Family Medicine

## 2015-03-17 VITALS — BP 102/80 | HR 57 | Temp 97.6°F | Ht 72.0 in | Wt 215.8 lb

## 2015-03-17 DIAGNOSIS — M25511 Pain in right shoulder: Secondary | ICD-10-CM

## 2015-03-17 NOTE — Progress Notes (Signed)
Pre visit review using our clinic review tool, if applicable. No additional management support is needed unless otherwise documented below in the visit note. 

## 2015-03-17 NOTE — Progress Notes (Signed)
   Subjective:    Patient ID: Stephen Bridges, male    DOB: 1970/09/18, 45 y.o.   MRN: [id number]  HPI Seen with mild right shoulder pain. 2 weeks ago he accidentally fell forward and caught himself on his right hand. Since that time is noted slight prominence of his right acromion compared to the left and minimal pain. He has actually still been lifting weights intermittently but had some mild pain around the region of acromioclavicular joint. Pain is relatively mild.  No pain with abduction. Denies any wrist or hand pain.  Past Medical History  Diagnosis Date  . Wheezing     exercise induced  . Allergy     allergic rhinitis  . Nosebleed     history of nosebleeds as child   Past Surgical History  Procedure Laterality Date  . Knee arthroscopy  1990    right knee, wrestling injury  . Wisdom tooth extraction  2014    reports that he has never smoked. He has never used smokeless tobacco. He reports that he drinks alcohol. He reports that he does not use illicit drugs. family history includes Cancer in his other; Hypertension in his father; Multiple sclerosis (age of onset: 53) in his mother; Multiple sclerosis (age of onset: 9) in his maternal uncle; Prostate cancer in his father; Thyroid disease in his other. Allergies  Allergen Reactions  . Omeprazole Rash      Review of Systems  Musculoskeletal: Negative for neck pain and neck stiffness.  Neurological: Negative for weakness and numbness.       Objective:   Physical Exam  Constitutional: He appears well-developed and well-nourished.  Cardiovascular: Normal rate and regular rhythm.   Musculoskeletal:  Slightly prominent acromion process on the right but minimally tender. He has very minimal tenderness in the acromioclavicular joint. He has full range of motion right shoulder. No clavicle tenderness otherwise. No erythema. No soft tissue swelling.  Neurological:  Rotator cuff strength is full.          Assessment &  Plan:  Recent fall with mild pain right acromioclavicular region. Doubt separation. No evidence for rotator cuff tear. Obtain x-rays with focus on weighted view to assess acromioclavicular joint

## 2015-05-19 ENCOUNTER — Encounter: Payer: Self-pay | Admitting: Adult Health

## 2015-05-19 ENCOUNTER — Ambulatory Visit (INDEPENDENT_AMBULATORY_CARE_PROVIDER_SITE_OTHER): Payer: BLUE CROSS/BLUE SHIELD | Admitting: Adult Health

## 2015-05-19 VITALS — BP 132/80 | Temp 98.4°F | Wt 215.9 lb

## 2015-05-19 DIAGNOSIS — W57XXXA Bitten or stung by nonvenomous insect and other nonvenomous arthropods, initial encounter: Secondary | ICD-10-CM | POA: Diagnosis not present

## 2015-05-19 DIAGNOSIS — T148 Other injury of unspecified body region: Secondary | ICD-10-CM | POA: Diagnosis not present

## 2015-05-19 MED ORDER — DOXYCYCLINE HYCLATE 100 MG PO CAPS: 100.0000 mg | ORAL_CAPSULE | Freq: Two times a day (BID) | ORAL | Status: DC

## 2015-05-19 NOTE — Progress Notes (Signed)
Subjective:    Patient ID: Stephen Bridges, male    DOB: 12-30-1970, 45 y.o.   MRN: [id number]  HPI 45 year old male who presents to the office today for a tick bite to his right leg that he noticed yesterday. He is unsure of how long the tick was on him. He was able to completely remove it. When he woke up this morning he noticed a faint red circle around the bite mark.   He brought the tick with him - it appears as a deer tick.  Denies any fevers, rash, or joint pain.    Review of Systems  Constitutional: Negative.   Respiratory: Negative.   Cardiovascular: Negative.   Skin: Positive for color change and rash.  Neurological: Negative.    Past Medical History  Diagnosis Date  . Wheezing     exercise induced  . Allergy     allergic rhinitis  . Nosebleed     history of nosebleeds as child    Social History   Social History  . Marital Status: Married    Spouse Name: N/A  . Number of Children: N/A  . Years of Education: N/A   Occupational History  . Not on file.   Social History Main Topics  . Smoking status: Never Smoker   . Smokeless tobacco: Never Used  . Alcohol Use: Yes     Comment: rare  . Drug Use: No  . Sexual Activity: No   Other Topics Concern  . Not on file   Social History Narrative   Regular exercise:  No   Married--2 sons   Occupation: computers (Forensic psychologist)    Past Surgical History  Procedure Laterality Date  . Knee arthroscopy  1990    right knee, wrestling injury  . Wisdom tooth extraction  2014    Family History  Problem Relation Age of Onset  . Thyroid disease Other   . Cancer Other     prostate  . Prostate cancer Father   . Hypertension Father   . Multiple sclerosis Mother 89  . Multiple sclerosis Maternal Uncle 40    Allergies  Allergen Reactions  . Omeprazole Rash    Current Outpatient Prescriptions on File Prior to Visit  Medication Sig Dispense Refill  . Multiple Vitamin (MULTIVITAMIN) tablet Take 1  tablet by mouth daily.    . Omega-3 Fatty Acids (FISH OIL) 1000 MG CAPS Take 1 capsule by mouth daily.     No current facility-administered medications on file prior to visit.    BP 132/80 mmHg  Temp(Src) 98.4 F (36.9 C) (Oral)  Wt 215 lb 14.4 oz (97.932 kg)       Objective:   Physical Exam  Constitutional: He is oriented to person, place, and time. He appears well-developed and well-nourished. No distress.  Musculoskeletal: Normal range of motion. He exhibits no edema or tenderness.  Neurological: He is alert and oriented to person, place, and time.  Skin: Skin is warm and dry. Rash noted. He is not diaphoretic. There is erythema. No pallor.  Very faint red bulls eye rash noticed on right leg.  No other rash noticed throughout body.   Psychiatric: He has a normal mood and affect. His behavior is normal. Judgment and thought content normal.  Vitals reviewed.      Assessment & Plan:  1. Tick bite - Will treat due to unknown time frame and possible Erythema migrans - doxycycline (VIBRAMYCIN) 100 MG capsule; Take 1 capsule (100 mg  total) by mouth 2 (two) times daily.  Dispense: 10 capsule; Refill: 0 - Follow up with any fevers, rashes, or joint pain.   Dorothyann Peng, NP

## 2015-05-19 NOTE — Patient Instructions (Addendum)
I have sent in a prescription for Doxycycline. Take this twice a day for 5 days.   Follow up with any signs or symptoms listed below.   Tick Bite Information Ticks are insects that attach themselves to the skin and draw blood for food. There are various types of ticks. Common types include wood ticks and deer ticks. Most ticks live in shrubs and grassy areas. Ticks can climb onto your body when you make contact with leaves or grass where the tick is waiting. The most common places on the body for ticks to attach themselves are the scalp, neck, armpits, waist, and groin. Most tick bites are harmless, but sometimes ticks carry germs that cause diseases. These germs can be spread to a person during the tick's feeding process. The chance of a disease spreading through a tick bite depends on:   The type of tick.  Time of year.   How long the tick is attached.   Geographic location.  HOW CAN YOU PREVENT TICK BITES? Take these steps to help prevent tick bites when you are outdoors:  Wear protective clothing. Long sleeves and long pants are best.   Wear white clothes so you can see ticks more easily.  Tuck your pant legs into your socks.   If walking on a trail, stay in the middle of the trail to avoid brushing against bushes.  Avoid walking through areas with long grass.  Put insect repellent on all exposed skin and along boot tops, pant legs, and sleeve cuffs.   Check clothing, hair, and skin repeatedly and before going inside.   Brush off any ticks that are not attached.  Take a shower or bath as soon as possible after being outdoors.  WHAT IS THE PROPER WAY TO REMOVE A TICK? Ticks should be removed as soon as possible to help prevent diseases caused by tick bites. 1. If latex gloves are available, put them on before trying to remove a tick.  2. Using fine-point tweezers, grasp the tick as close to the skin as possible. You may also use curved forceps or a tick removal  tool. Grasp the tick as close to its head as possible. Avoid grasping the tick on its body. 3. Pull gently with steady upward pressure until the tick lets go. Do not twist the tick or jerk it suddenly. This may break off the tick's head or mouth parts. 4. Do not squeeze or crush the tick's body. This could force disease-carrying fluids from the tick into your body.  5. After the tick is removed, wash the bite area and your hands with soap and water or other disinfectant such as alcohol. 6. Apply a small amount of antiseptic cream or ointment to the bite site.  7. Wash and disinfect any instruments that were used.  Do not try to remove a tick by applying a hot match, petroleum jelly, or fingernail polish to the tick. These methods do not work and may increase the chances of disease being spread from the tick bite.  WHEN SHOULD YOU SEEK MEDICAL CARE? Contact your health care provider if you are unable to remove a tick from your skin or if a part of the tick breaks off and is stuck in the skin.  After a tick bite, you need to be aware of signs and symptoms that could be related to diseases spread by ticks. Contact your health care provider if you develop any of the following in the days or weeks after the tick  bite:  Unexplained fever.  Rash. A circular rash that appears days or weeks after the tick bite may indicate the possibility of Lyme disease. The rash may resemble a target with a bull's-eye and may occur at a different part of your body than the tick bite.  Redness and swelling in the area of the tick bite.   Tender, swollen lymph glands.   Diarrhea.   Weight loss.   Cough.   Fatigue.   Muscle, joint, or bone pain.   Abdominal pain.   Headache.   Lethargy or a change in your level of consciousness.  Difficulty walking or moving your legs.   Numbness in the legs.   Paralysis.  Shortness of breath.   Confusion.   Repeated vomiting.    This information  is not intended to replace advice given to you by your health care provider. Make sure you discuss any questions you have with your health care provider.   Document Released: 12/23/1999 Document Revised: 01/15/2014 Document Reviewed: 06/04/2012 Elsevier Interactive Patient Education [employer].

## 2015-11-22 DIAGNOSIS — H5310 Unspecified subjective visual disturbances: Secondary | ICD-10-CM | POA: Diagnosis not present

## 2015-11-22 DIAGNOSIS — H5213 Myopia, bilateral: Secondary | ICD-10-CM | POA: Diagnosis not present

## 2015-12-05 ENCOUNTER — Other Ambulatory Visit (INDEPENDENT_AMBULATORY_CARE_PROVIDER_SITE_OTHER): Payer: BLUE CROSS/BLUE SHIELD

## 2015-12-05 ENCOUNTER — Other Ambulatory Visit: Payer: BLUE CROSS/BLUE SHIELD

## 2015-12-05 DIAGNOSIS — Z0000 Encounter for general adult medical examination without abnormal findings: Secondary | ICD-10-CM | POA: Diagnosis not present

## 2015-12-05 LAB — HEPATIC FUNCTION PANEL
ALBUMIN: 4.4 g/dL (ref 3.5–5.2)
ALT: 40 U/L (ref 0–53)
AST: 31 U/L (ref 0–37)
Alkaline Phosphatase: 62 U/L (ref 39–117)
Bilirubin, Direct: 0 mg/dL (ref 0.0–0.3)
Total Bilirubin: 0.5 mg/dL (ref 0.2–1.2)
Total Protein: 7.2 g/dL (ref 6.0–8.3)

## 2015-12-05 LAB — CBC WITH DIFFERENTIAL/PLATELET
BASOS PCT: 0.5 % (ref 0.0–3.0)
Basophils Absolute: 0 10*3/uL (ref 0.0–0.1)
EOS PCT: 0.5 % (ref 0.0–5.0)
Eosinophils Absolute: 0 10*3/uL (ref 0.0–0.7)
HCT: 43.8 % (ref 39.0–52.0)
HEMOGLOBIN: 14.9 g/dL (ref 13.0–17.0)
LYMPHS ABS: 1.5 10*3/uL (ref 0.7–4.0)
Lymphocytes Relative: 26.4 % (ref 12.0–46.0)
MCHC: 34.1 g/dL (ref 30.0–36.0)
MCV: 87.2 fl (ref 78.0–100.0)
MONOS PCT: 5.9 % (ref 3.0–12.0)
Monocytes Absolute: 0.3 10*3/uL (ref 0.1–1.0)
Neutro Abs: 3.9 10*3/uL (ref 1.4–7.7)
Neutrophils Relative %: 66.7 % (ref 43.0–77.0)
Platelets: 248 10*3/uL (ref 150.0–400.0)
RBC: 5.03 Mil/uL (ref 4.22–5.81)
RDW: 13.8 % (ref 11.5–15.5)
WBC: 5.9 10*3/uL (ref 4.0–10.5)

## 2015-12-05 LAB — POC URINALSYSI DIPSTICK (AUTOMATED)
Bilirubin, UA: NEGATIVE
Blood, UA: NEGATIVE
Glucose, UA: NEGATIVE
Ketones, UA: NEGATIVE
LEUKOCYTES UA: NEGATIVE
NITRITE UA: NEGATIVE
PH UA: 6
PROTEIN UA: NEGATIVE
Spec Grav, UA: 1.01
UROBILINOGEN UA: 0.2

## 2015-12-05 LAB — LIPID PANEL
CHOLESTEROL: 240 mg/dL — AB (ref 0–200)
HDL: 40.1 mg/dL (ref >39.00)
LDL Cholesterol: 177 mg/dL — ABNORMAL HIGH (ref 0–99)
NonHDL: 199.45
TRIGLYCERIDES: 114 mg/dL (ref 0.0–149.0)
Total CHOL/HDL Ratio: 6
VLDL: 22.8 mg/dL (ref 0.0–40.0)

## 2015-12-05 LAB — BASIC METABOLIC PANEL
BUN: 12 mg/dL (ref 6–23)
CALCIUM: 9.2 mg/dL (ref 8.4–10.5)
CHLORIDE: 103 meq/L (ref 96–112)
CO2: 25 mEq/L (ref 19–32)
CREATININE: 1.01 mg/dL (ref 0.40–1.50)
GFR: 84.65 mL/min (ref >60.00)
Glucose, Bld: 98 mg/dL (ref 70–99)
Potassium: 3.7 mEq/L (ref 3.5–5.1)
Sodium: 137 mEq/L (ref 135–145)

## 2015-12-05 LAB — TSH: TSH: 2.49 u[IU]/mL (ref 0.35–4.50)

## 2015-12-09 ENCOUNTER — Encounter: Payer: BLUE CROSS/BLUE SHIELD | Admitting: Family Medicine

## 2015-12-09 LAB — PSA: PSA: 2.83

## 2015-12-27 ENCOUNTER — Encounter: Payer: Self-pay | Admitting: Family Medicine

## 2015-12-27 ENCOUNTER — Ambulatory Visit (INDEPENDENT_AMBULATORY_CARE_PROVIDER_SITE_OTHER): Payer: BLUE CROSS/BLUE SHIELD | Admitting: Family Medicine

## 2015-12-27 VITALS — BP 126/88 | HR 72 | Temp 98.0°F | Ht 72.0 in | Wt 226.0 lb

## 2015-12-27 DIAGNOSIS — G43109 Migraine with aura, not intractable, without status migrainosus: Secondary | ICD-10-CM | POA: Insufficient documentation

## 2015-12-27 DIAGNOSIS — Z0000 Encounter for general adult medical examination without abnormal findings: Secondary | ICD-10-CM

## 2015-12-27 DIAGNOSIS — Z23 Encounter for immunization: Secondary | ICD-10-CM | POA: Diagnosis not present

## 2015-12-27 NOTE — Assessment & Plan Note (Signed)
We reviewed lipids. 10 year risk under 5% but discussed at age 45 with same #s greater than 11%. Has had weight gain. Used to be a runner now primarily a weight lifter. Has not been eating as healthy- switched from more plant based, white based diet to more red meat and less healthy foods. Stopped fish oil as well. 10 year risk about 3.5%. Stress higher. Plans to improve his diet/exercise. Thinking about biking with foot injury. Recheck 1 year and hopeful for improvement

## 2015-12-27 NOTE — Progress Notes (Signed)
Phone: [phone]  Subjective:  Patient presents today for their annual physical. Chief complaint-noted.   See problem oriented charting- ROS- full  review of systems was completed and negative except for: floaters/flashes of lights followed by [facility]. No chest pain or shortness of breath. No headache or blurry vision.   The following were reviewed and entered/updated in epic: Past Medical History:  Diagnosis Date  . Allergy    allergic rhinitis  . Nosebleed    history of nosebleeds as child  . Wheezing    exercise induced   Patient Active Problem List   Diagnosis Date Noted  . Ocular migraine 12/27/2015    Priority: Medium  . HYPERLIPIDEMIA 11/04/2006    Priority: Medium  . Chronic cough 06/12/2012    Priority: Low  . Lipoma 06/12/2012    Priority: Low  . ALLERGIC RHINITIS 11/04/2006    Priority: Low  . GERD 11/04/2006    Priority: Low   Past Surgical History:  Procedure Laterality Date  . KNEE ARTHROSCOPY  1990   right knee, wrestling injury  . WISDOM TOOTH EXTRACTION  2014    Family History  Problem Relation Age of Onset  . Multiple sclerosis Mother 54  . Prostate cancer Father     62s or 42s  . Hypertension Father   . Thyroid disease Other   . Cancer Other     prostate  . Multiple sclerosis Maternal Uncle 40    Medications- reviewed and updated Current Outpatient Prescriptions  Medication Sig Dispense Refill  . Multiple Vitamin (MULTIVITAMIN) tablet Take 1 tablet by mouth daily.    . Omega-3 Fatty Acids (FISH OIL) 1000 MG CAPS Take 1 capsule by mouth daily.     No current facility-administered medications for this visit.     Allergies-reviewed and updated Allergies  Allergen Reactions  . Omeprazole Rash    Social History   Social History  . Marital status: Married    Spouse name: N/A  . Number of children: N/A  . Years of education: N/A   Social History Main Topics  . Smoking status: Never Smoker  . Smokeless tobacco: Never Used   . Alcohol use Yes     Comment: rare  . Drug use: No  . Sexual activity: No   Other Topics Concern  . None   Social History Narrative   Married--2 sons (see his wife kim as well) Sons 10 and 7 in 2017.       Occupation: Radio producer (Forensic psychologist) at [employer]    Objective: BP 126/88   Pulse 72   Temp 98 F (36.7 C) (Oral)   Ht 6' (1.829 m)   Wt 226 lb (102.5 kg)   SpO2 96%   BMI 30.65 kg/m  Gen: NAD, resting comfortably HEENT: Mucous membranes are moist. Oropharynx normal Neck: no thyromegaly CV: RRR no murmurs rubs or gallops Lungs: CTAB no crackles, wheeze, rhonchi Abdomen: soft/nontender/nondistended/normal bowel sounds. No rebound or guarding. overweight  Ext: no edema MSK: plain at insertion point of plantar fascia on left foot Skin: warm, dry, lipoma right mid axillary line at least 5 x 5 cm (unchanged per patient) Neuro: grossly normal, moves all extremities, PERRLA Rectal: normal tone, diffusely enlarged prostate, no masses or tenderness Male genitalia: normal findings: normal circumcised penis and normal testes palpated bilaterally   Assessment/Plan:  45 y.o. male presenting for annual physical.  Health Maintenance counseling: 1. Anticipatory guidance: Patient counseled regarding regular dental exams, eye exams, wearing seatbelts.  2. Risk  factor reduction:  Advised patient of need for regular exercise and diet rich and fruits and vegetables to reduce risk of heart attack and stroke. Discussed weight loss- wants to return to prior diet- see below 3. Immunizations/screenings/ancillary studies Immunization History  Administered Date(s) Administered  . Influenza Whole 12/12/2009  . Influenza,inj,Quad PF,36+ Mos 11/05/2012, 10/21/2013, 11/19/2014, 12/27/2015  . MMR 05/28/2009  . Td 05/24/2009  flu shot given today Health Maintenance Due  Topic Date Due  . HIV Screening  Declines  04/04/1985   4. Prostate cancer screening- BPH on exam. Family  history of prostate cancer dad in 30s or 16s. Draw PSA today Lab Results  Component Value Date   PSA 1.85 10/22/2014   PSA 1.66 10/30/2013   5. Colon cancer screening - no family history, start at age 89 6. Skin cancer screening- does not see derm. Advised regular sunscreen. Denies concerning lesions.   Status of chronic or acute concerns   Watch BP- slightly higher for him. At goal per [facility]  Left foot plantar fasciitis -3-4 months. Backed off elliptical. 4 weeks ago 3 mile run with family and worsened since then. High arch.  Plan: advised home exercises, 7 day trial nsaids (states not likely to do this), supportive shoes- agrees to sports med referral if not improving within a month  HYPERLIPIDEMIA We reviewed lipids. 10 year risk under 5% but discussed at age 83 with same #s greater than 11%. Has had weight gain. Used to be a runner now primarily a weight lifter. Has not been eating as healthy- switched from more plant based, white based diet to more red meat and less healthy foods. Stopped fish oil as well. 10 year risk about 3.5%. Stress higher. Plans to improve his diet/exercise. Thinking about biking with foot injury. Recheck 1 year and hopeful for improvement  1 year CPE, happy to see in 6 months for weight check (unlikely)  Orders Placed This Encounter  Procedures  . Flu Vaccine QUAD 36+ mos IM  . PSA   Return precautions advised.   Garret Reddish, MD

## 2015-12-27 NOTE — Progress Notes (Signed)
Pre visit review using our clinic review tool, if applicable. No additional management support is needed unless otherwise documented below in the visit note. 

## 2015-12-27 NOTE — Patient Instructions (Signed)
psa before you leave  Blood pressure and cholesterol up some- let's work on the diet and I agree bumping your cardio up some is reasonable  Trial exercises for plantar fasciitis. Also consider aleve twice a day for 7 days. Let me know if no better in about a month- can do sports medicine referral.   Doristine Devoid to meet you! Glad to have you and your wife as patients. 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

## 2015-12-28 LAB — PSA: PSA: 2.83 ng/mL (ref 0.10–4.00)

## 2016-04-02 ENCOUNTER — Telehealth: Payer: Self-pay | Admitting: Family Medicine

## 2016-04-02 DIAGNOSIS — R972 Elevated prostate specific antigen [PSA]: Secondary | ICD-10-CM

## 2016-04-02 NOTE — Telephone Encounter (Signed)
Pt state that he was going to have a referral to a Urologist and 4-6 weeks after this CPE in December of 2017 and he has not heard anything from anyone so he just want to go ahead and get it done.

## 2016-04-02 NOTE — Telephone Encounter (Signed)
Looked through office note. I didn't see it mentioned about a referral to Urology but I did see this note. OK to refer?  Prostate cancer screening- BPH on exam. Family history of prostate cancer dad in 31s or 71s. Draw PSA today

## 2016-04-02 NOTE — Telephone Encounter (Signed)
Called x 2. LVM second time.   From result note on 12/27/15: "PSA has jumped up almost a point in a year. Stephen Bridges- can you place a referral to urology under elevated PSA.   Stephen Bridges- with your family history I want you to get an evaluation with urology given the trend upwards (sometimes these exams vary a lot from year to year but just want to be on safe side)"  I did not see a response from you on the note. It is possible that I did not route this to you Stephen Bridges and please apologize to patient for that- I called twice as I wanted to apologize. I did enter the referral today.   Please let him know he should hear within 3 weeks, definitely call us back if he has not heard in that timeframe.

## 2016-04-04 NOTE — Telephone Encounter (Signed)
Spoke with patient who verbalized understanding.

## 2016-05-01 ENCOUNTER — Telehealth: Payer: Self-pay | Admitting: Family Medicine

## 2016-05-01 NOTE — Telephone Encounter (Signed)
[facility] - Client [place] [facility]  Patient Name: SYMEON PULEO  DOB: 1970/05/08    Initial Comment Spouse has a tick bite. Caller is not with the pt.    Nurse Assessment  Nurse: Harlow Mares, RN, Suanne Marker Date/Time (Eastern Time): 05/01/2016 5:33:53 PM  Confirm and document reason for call. If symptomatic, describe symptoms. ---Spouse has a possible tick bite. Pt is currently in [place] working. Is in meetings all days. Reports that he has pics of the bite mark. he has not seen a tick on him, but has a possible insect bite on the shoulder that is red and getting and getting larger since noted.  Does the patient have any new or worsening symptoms? ---Yes  Will a triage be completed? ---Yes  Related visit to physician within the last 2 weeks? ---No  Does the PT have any chronic conditions? (i.e. diabetes, asthma, etc.) ---No  Is this a behavioral health or substance abuse call? ---No     Guidelines    Guideline Title Affirmed Question Affirmed Notes  Insect Bite All other insect bites    Final Disposition User   [place], RN, Suanne Marker    Disagree/Comply: Comply

## 2016-05-02 NOTE — Telephone Encounter (Signed)
With worsening rash- may need bloodwork and start of antibiotics. I would advise him to go to a local urgent care. If he was in town- would want to see him. Think unwise to treat without actually looking at the rash.

## 2016-05-02 NOTE — Telephone Encounter (Signed)
Called patient and left a detailed voicemail message with a call back number for questions or concerns

## 2016-05-21 DIAGNOSIS — R972 Elevated prostate specific antigen [PSA]: Secondary | ICD-10-CM | POA: Diagnosis not present

## 2016-05-24 ENCOUNTER — Encounter: Payer: Self-pay | Admitting: Family Medicine

## 2016-06-22 ENCOUNTER — Ambulatory Visit (INDEPENDENT_AMBULATORY_CARE_PROVIDER_SITE_OTHER): Payer: BLUE CROSS/BLUE SHIELD | Admitting: Family Medicine

## 2016-06-22 ENCOUNTER — Encounter: Payer: Self-pay | Admitting: Family Medicine

## 2016-06-22 VITALS — BP 108/78 | HR 94 | Temp 98.7°F | Ht 72.0 in | Wt 223.8 lb

## 2016-06-22 DIAGNOSIS — R197 Diarrhea, unspecified: Secondary | ICD-10-CM | POA: Diagnosis not present

## 2016-06-22 NOTE — Progress Notes (Signed)
Subjective:  Stephen Bridges is a 46 y.o. year old very pleasant male patient who presents for/with See problem oriented charting ROS- no fever, chills. No blood in stool. No melena.    Past Medical History-  Patient Active Problem List   Diagnosis Date Noted  . Ocular migraine 12/27/2015    Priority: Medium  . HYPERLIPIDEMIA 11/04/2006    Priority: Medium  . Chronic cough 06/12/2012    Priority: Low  . Lipoma 06/12/2012    Priority: Low  . ALLERGIC RHINITIS 11/04/2006    Priority: Low  . GERD 11/04/2006    Priority: Low    Medications- reviewed and updated Current Outpatient Prescriptions  Medication Sig Dispense Refill  . Multiple Vitamin (MULTIVITAMIN) tablet Take 1 tablet by mouth daily.    . Omega-3 Fatty Acids (FISH OIL) 1000 MG CAPS Take 1 capsule by mouth daily.     No current facility-administered medications for this visit.     Objective: BP 108/78 (BP Location: Left Arm, Patient Position: Sitting, Cuff Size: Large)   Pulse 94   Temp 98.7 F (37.1 C) (Oral)   Ht 6' (1.829 m)   Wt 223 lb 12.8 oz (101.5 kg)   SpO2 92%   BMI 30.35 kg/m  Gen: NAD, resting comfortably, well appearing CV: RRR no murmurs rubs or gallops Lungs: CTAB no crackles, wheeze, rhonchi Abdomen: soft/nontender/nondistended/normal bowel sounds. No rebound or guarding.  Ext: no edema Skin: warm, dry, a few small pimples on chest/possible folliculitis  Assessment/Plan:  Diarrhea, unspecified type - Plan: Clostridium Difficile by PCR, Fecal lactoferrin, quant, Stool culture, Clostridium Difficile by PCR, Fecal lactoferrin, quant, Stool culture S: patient states habout 3 weeks ago he started to notice stools became progressively loose and had some diarrhea. Thinks perhaps slightly softern for a few weeks befoerthat. Now he has had usually 3-4 very loose bowel movements each day that would conform to the shape of ta cup- some watery and some just small parts of stool. Usually has 1-2 early in the  morning that are full of liquid and gas and then a bit later has a more formed movement with several small pieces and often has another event in the afternoon. He does not feel poorly. He wondered if turmeric was the cause (has been taking for 6 months) and stopped this without relief. He does feel like there have been small amounts of mucus at times. Never had a colonoscopy- no family history colon cancer  Of note, his baseline stool consistency is more toward constipation and usually has 1-2 bowel movements a day but much more formed than present bowel movements.   A/P: Diarrhea of unclear cause. Will get stool studies as below. Given chronicity but mild severity discussed probiotic if lactoferrin is negative. If positive- wait on culture and potentially treat with antibiotic. If either of these steps fail within 2 weeks to resolve issues- would plan to refer to GI. Labs as below.   Orders Placed This Encounter  Procedures  . Clostridium Difficile by PCR    Standing Status:   Future    Number of Occurrences:   1    Standing Expiration Date:   06/22/2017    Order Specific Question:   Is your patient experiencing loose or watery stools (3 or more in 24 hours)?    Answer:   Yes    Order Specific Question:   Has the patient received laxatives in the last 24 hours?    Answer:   No  Order Specific Question:   Has a negative Cdiff test resulted in the last 7 days?    Answer:   No  . Stool culture    Standing Status:   Future    Number of Occurrences:   1    Standing Expiration Date:   06/22/2017  . Fecal lactoferrin, quant    Standing Status:   Future    Number of Occurrences:   1    Standing Expiration Date:   06/22/2017   Return precautions advised.  Garret Reddish, MD

## 2016-06-22 NOTE — Patient Instructions (Addendum)
Stop by lab to get stool collection kit. Bring back stool on Monday so we can evaluate for infectious cause.   If lactoferrin (inflammation test) is negative I will have you start either align or florastor probiotic.   If not improving within a month on that would likely refer you to GI to consider early colonoscopy  I do not think tick bite is related to this

## 2016-06-25 ENCOUNTER — Encounter: Payer: Self-pay | Admitting: Family Medicine

## 2016-06-26 LAB — FECAL LACTOFERRIN, QUANT: Lactoferrin: NEGATIVE

## 2016-06-27 LAB — CLOSTRIDIUM DIFFICILE BY PCR: CDIFFPCR: NOT DETECTED

## 2016-06-29 ENCOUNTER — Encounter: Payer: Self-pay | Admitting: Family Medicine

## 2016-06-29 LAB — STOOL CULTURE

## 2016-07-02 ENCOUNTER — Encounter: Payer: Self-pay | Admitting: Gastroenterology

## 2016-07-02 ENCOUNTER — Other Ambulatory Visit: Payer: Self-pay

## 2016-07-02 DIAGNOSIS — K5900 Constipation, unspecified: Secondary | ICD-10-CM

## 2016-07-16 ENCOUNTER — Encounter: Payer: Self-pay | Admitting: Family Medicine

## 2016-07-16 DIAGNOSIS — R197 Diarrhea, unspecified: Secondary | ICD-10-CM

## 2016-08-15 ENCOUNTER — Other Ambulatory Visit (INDEPENDENT_AMBULATORY_CARE_PROVIDER_SITE_OTHER): Payer: BLUE CROSS/BLUE SHIELD

## 2016-08-15 ENCOUNTER — Encounter: Payer: Self-pay | Admitting: Gastroenterology

## 2016-08-15 ENCOUNTER — Ambulatory Visit (INDEPENDENT_AMBULATORY_CARE_PROVIDER_SITE_OTHER): Payer: BLUE CROSS/BLUE SHIELD | Admitting: Gastroenterology

## 2016-08-15 VITALS — BP 120/88 | HR 68 | Ht 72.0 in | Wt 222.0 lb

## 2016-08-15 DIAGNOSIS — R194 Change in bowel habit: Secondary | ICD-10-CM

## 2016-08-15 LAB — TSH: TSH: 2.5 u[IU]/mL (ref 0.35–4.50)

## 2016-08-15 LAB — SEDIMENTATION RATE: Sed Rate: 6 mm/hr (ref 0–15)

## 2016-08-15 MED ORDER — NA SULFATE-K SULFATE-MG SULF 17.5-3.13-1.6 GM/177ML PO SOLN: 1.0000 | Freq: Once | ORAL | 0 refills | Status: AC
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
## 2016-08-15 NOTE — Patient Instructions (Addendum)
You will have labs checked today in the basement lab.  Please head down after you check out with the front desk  (GI pathogen panel, TSH, ESR, IgA level, tTG).   You will be set up for a colonoscopy.  Start taking imodium one pill every morning for now.  Normal BMI (Body Mass Index- based on height and weight) is between 19 and 25. Your BMI today is Body mass index is 30.11 kg/m. Marland Kitchen Please consider follow up  regarding your BMI with your Primary Care Provider.

## 2016-08-16 LAB — TISSUE TRANSGLUTAMINASE, IGA: TISSUE TRANSGLUTAMINASE AB, IGA: 1 U/mL (ref <4)

## 2016-08-20 ENCOUNTER — Other Ambulatory Visit: Payer: Self-pay

## 2016-08-20 DIAGNOSIS — R194 Change in bowel habit: Secondary | ICD-10-CM

## 2016-09-03 DIAGNOSIS — H5310 Unspecified subjective visual disturbances: Secondary | ICD-10-CM | POA: Diagnosis not present

## 2016-09-08 HISTORY — PX: COLONOSCOPY: SHX174

## 2016-09-11 DIAGNOSIS — R972 Elevated prostate specific antigen [PSA]: Secondary | ICD-10-CM | POA: Diagnosis not present

## 2016-09-11 LAB — PSA: PSA: 2.09

## 2016-09-13 DIAGNOSIS — R972 Elevated prostate specific antigen [PSA]: Secondary | ICD-10-CM | POA: Diagnosis not present

## 2016-09-18 ENCOUNTER — Encounter: Payer: Self-pay | Admitting: Gastroenterology

## 2016-09-19 ENCOUNTER — Encounter: Payer: Self-pay | Admitting: Family Medicine

## 2016-09-20 ENCOUNTER — Other Ambulatory Visit: Payer: BLUE CROSS/BLUE SHIELD

## 2016-09-20 DIAGNOSIS — R194 Change in bowel habit: Secondary | ICD-10-CM | POA: Diagnosis not present

## 2016-09-20 DIAGNOSIS — R197 Diarrhea, unspecified: Secondary | ICD-10-CM

## 2016-09-20 NOTE — Addendum Note (Signed)
Addended by: Kathlyn Sacramento on: 09/20/2016 12:46 PM   Modules accepted: Orders

## 2016-09-24 ENCOUNTER — Encounter: Payer: Self-pay | Admitting: Gastroenterology

## 2016-09-24 ENCOUNTER — Ambulatory Visit (AMBULATORY_SURGERY_CENTER): Payer: BLUE CROSS/BLUE SHIELD | Admitting: Gastroenterology

## 2016-09-24 VITALS — BP 128/74 | HR 63 | Temp 99.5°F | Resp 16 | Ht 72.0 in | Wt 222.0 lb

## 2016-09-24 DIAGNOSIS — K635 Polyp of colon: Secondary | ICD-10-CM

## 2016-09-24 DIAGNOSIS — D120 Benign neoplasm of cecum: Secondary | ICD-10-CM | POA: Diagnosis not present

## 2016-09-24 DIAGNOSIS — D125 Benign neoplasm of sigmoid colon: Secondary | ICD-10-CM

## 2016-09-24 DIAGNOSIS — R194 Change in bowel habit: Secondary | ICD-10-CM

## 2016-09-24 DIAGNOSIS — Z1211 Encounter for screening for malignant neoplasm of colon: Secondary | ICD-10-CM | POA: Diagnosis not present

## 2016-09-24 DIAGNOSIS — R197 Diarrhea, unspecified: Secondary | ICD-10-CM

## 2016-09-24 LAB — GASTROINTESTINAL PATHOGEN PANEL PCR
C. DIFFICILE TOX A/B, PCR: NOT DETECTED
CRYPTOSPORIDIUM, PCR: NOT DETECTED
Campylobacter, PCR: NOT DETECTED
E COLI (STEC) STX1/STX2, PCR: NOT DETECTED
E COLI 0157, PCR: NOT DETECTED
E coli (ETEC) LT/ST PCR: NOT DETECTED
Giardia lamblia, PCR: NOT DETECTED
Norovirus, PCR: NOT DETECTED
ROTAVIRUS, PCR: NOT DETECTED
Salmonella, PCR: NOT DETECTED
Shigella, PCR: NOT DETECTED

## 2016-09-24 MED ORDER — SODIUM CHLORIDE 0.9 % IV SOLN: 500.0000 mL | INTRAVENOUS | Status: DC

## 2016-09-24 NOTE — Patient Instructions (Signed)
YOU HAD AN ENDOSCOPIC PROCEDURE TODAY AT [facility]:   Refer to the procedure report that was given to you for any specific questions about what was found during the examination.  If the procedure report does not answer your questions, please call your gastroenterologist to clarify.  If you requested that your care partner not be given the details of your procedure findings, then the procedure report has been included in a sealed envelope for you to review at your convenience later.  YOU SHOULD EXPECT: Some feelings of bloating in the abdomen. Passage of more gas than usual.  Walking can help get rid of the air that was put into your GI tract during the procedure and reduce the bloating. If you had a lower endoscopy (such as a colonoscopy or flexible sigmoidoscopy) you may notice spotting of blood in your stool or on the toilet paper. If you underwent a bowel prep for your procedure, you may not have a normal bowel movement for a few days.  Please Note:  You might notice some irritation and congestion in your nose or some drainage.  This is from the oxygen used during your procedure.  There is no need for concern and it should clear up in a day or so.  SYMPTOMS TO REPORT IMMEDIATELY:   Following lower endoscopy (colonoscopy or flexible sigmoidoscopy):  Excessive amounts of blood in the stool  Significant tenderness or worsening of abdominal pains  Swelling of the abdomen that is new, acute  Fever of 100F or higher  For urgent or emergent issues, a gastroenterologist can be reached at any hour by calling [phone].  Please see handout given on polyps.   DIET:  We do recommend a small meal at first, but then you may proceed to your regular diet.  Drink plenty of fluids but you should avoid alcoholic beverages for 24 hours.  ACTIVITY:  You should plan to take it easy for the rest of today and you should NOT DRIVE or use heavy machinery until tomorrow (because of the  sedation medicines used during the test).    FOLLOW UP: Our staff will call the number listed on your records the next business day following your procedure to check on you and address any questions or concerns that you may have regarding the information given to you following your procedure. If we do not reach you, we will leave a message.  However, if you are feeling well and you are not experiencing any problems, there is no need to return our call.  We will assume that you have returned to your regular daily activities without incident.  If any biopsies were taken you will be contacted by phone or by letter within the next 1-3 weeks.  Please call us at [phone] if you have not heard about the biopsies in 3 weeks.    SIGNATURES/CONFIDENTIALITY: You and/or your care partner have signed paperwork which will be entered into your electronic medical record.  These signatures attest to the fact that that the information above on your After Visit Summary has been reviewed and is understood.  Full responsibility of the confidentiality of this discharge information lies with you and/or your care-partner.  Thank you for letting us take care of your healthcare needs today.

## 2016-09-24 NOTE — Progress Notes (Signed)
A and O x3. Report to RN. Tolerated MAC anesthesia well.

## 2016-09-24 NOTE — Progress Notes (Signed)
Called to room to assist during endoscopic procedure.  Patient ID and intended procedure confirmed with present staff. Received instructions for my participation in the procedure from the performing physician.  

## 2016-09-24 NOTE — Op Note (Signed)
[facility] Patient Name: Stephen Bridges Procedure Date: 09/24/2016 1:52 PM MRN: [id number] Endoscopist: Milus Banister , MD Age: 46 Referring MD:  Date of Birth: 09/15/1970 Gender: Male Account #: [id number] Procedure:                Colonoscopy Indications:              Change in bowel habits; looser than usual stools Medicines:                Monitored Anesthesia Care Procedure:                Pre-Anesthesia Assessment:                           - Prior to the procedure, a History and Physical                            was performed, and patient medications and                            allergies were reviewed. The patient's tolerance of                            previous anesthesia was also reviewed. The risks                            and benefits of the procedure and the sedation                            options and risks were discussed with the patient.                            All questions were answered, and informed consent                            was obtained. Prior Anticoagulants: The patient has                            taken no previous anticoagulant or antiplatelet                            agents. ASA Grade Assessment: II - A patient with                            mild systemic disease. After reviewing the risks                            and benefits, the patient was deemed in                            satisfactory condition to undergo the procedure.                           After obtaining informed consent, the colonoscope  was passed under direct vision. Throughout the                            procedure, the patient's blood pressure, pulse, and                            oxygen saturations were monitored continuously. The                            Model CF-HQ190L [id number]) scope was introduced                            through the anus and advanced to the the terminal                            ileum. The  colonoscopy was performed without                            difficulty. The patient tolerated the procedure                            well. The quality of the bowel preparation was                            excellent. The terminal ileum, ileocecal valve,                            appendiceal orifice, and rectum were photographed. Scope In: 1:58:44 PM Scope Out: 2:11:46 PM Scope Withdrawal Time: 0 hours 9 minutes 35 seconds  Total Procedure Duration: 0 hours 13 minutes 2 seconds  Findings:                 The terminal ileum appeared normal.                           Two sessile polyps were found in the sigmoid colon                            and cecum. The polyps were 2 to 3 mm in size. These                            polyps were removed with a cold snare. Resection                            and retrieval were complete.                           The exam was otherwise without abnormality on                            direct and retroflexion views.                           Biopsies for histology were taken with a cold  forceps from the entire colon for evaluation of                            microscopic colitis. Complications:            No immediate complications. Estimated blood loss:                            None. Estimated Blood Loss:     Estimated blood loss: none. Impression:               - The examined portion of the ileum was normal.                           - Two 2 to 3 mm polyps in the sigmoid colon and in                            the cecum, removed with a cold snare. Resected and                            retrieved.                           - The examination was otherwise normal on direct                            and retroflexion views.                           - Biopsies were taken with a cold forceps from the                            entire colon for evaluation of microscopic colitis. Recommendation:           - Patient has a  contact number available for                            emergencies. The signs and symptoms of potential                            delayed complications were discussed with the                            patient. Return to normal activities tomorrow.                            Written discharge instructions were provided to the                            patient.                           - Resume previous diet.                           - Continue present medications.                           -  Await path results and also results from your                            recent stool studies.                           You will receive a letter within 2-3 weeks with the                            pathology results and my final recommendations.                           If the polyp(s) is proven to be 'pre-cancerous' on                            pathology, you will need repeat colonoscopy in 5                            years. If the polyp(s) is NOT 'precancerous' on                            pathology then you should repeat colon cancer                            screening in 10 years with colonoscopy without need                            for colon cancer screening by any method prior to                            then (including stool testing). Milus Banister, MD 09/24/2016 2:17:14 PM This report has been signed electronically.

## 2016-09-25 ENCOUNTER — Telehealth: Payer: Self-pay | Admitting: *Deleted

## 2016-09-25 NOTE — Telephone Encounter (Signed)
  Follow up Call-  Call back number 09/24/2016  Post procedure Call Back phone  # [phone]  Permission to leave phone message Yes  Some recent data might be hidden     Patient questions:  Do you have a fever, pain , or abdominal swelling? No. Pain Score  0 *  Have you tolerated food without any problems? Yes.    Have you been able to return to your normal activities? Yes.    Do you have any questions about your discharge instructions: Diet   No. Medications  No. Follow up visit  No.  Do you have questions or concerns about your Care? No.  Actions: * If pain score is 4 or above: No action needed, pain <4.

## 2016-10-02 ENCOUNTER — Encounter: Payer: Self-pay | Admitting: Gastroenterology

## 2016-10-03 ENCOUNTER — Encounter: Payer: Self-pay | Admitting: Family Medicine

## 2016-10-03 DIAGNOSIS — Z86010 Personal history of colonic polyps: Secondary | ICD-10-CM | POA: Insufficient documentation

## 2016-10-03 DIAGNOSIS — Z860101 Personal history of adenomatous and serrated colon polyps: Secondary | ICD-10-CM | POA: Insufficient documentation

## 2016-11-07 DIAGNOSIS — M25571 Pain in right ankle and joints of right foot: Secondary | ICD-10-CM | POA: Diagnosis not present

## 2016-12-20 ENCOUNTER — Other Ambulatory Visit: Payer: BLUE CROSS/BLUE SHIELD

## 2016-12-21 DIAGNOSIS — H5213 Myopia, bilateral: Secondary | ICD-10-CM | POA: Diagnosis not present

## 2016-12-21 DIAGNOSIS — H5310 Unspecified subjective visual disturbances: Secondary | ICD-10-CM | POA: Diagnosis not present

## 2016-12-27 ENCOUNTER — Ambulatory Visit (INDEPENDENT_AMBULATORY_CARE_PROVIDER_SITE_OTHER): Payer: BLUE CROSS/BLUE SHIELD | Admitting: Family Medicine

## 2016-12-27 ENCOUNTER — Encounter: Payer: Self-pay | Admitting: Family Medicine

## 2016-12-27 VITALS — BP 100/82 | HR 87 | Temp 98.0°F | Ht 72.0 in | Wt 209.6 lb

## 2016-12-27 DIAGNOSIS — Z125 Encounter for screening for malignant neoplasm of prostate: Secondary | ICD-10-CM

## 2016-12-27 DIAGNOSIS — Z0000 Encounter for general adult medical examination without abnormal findings: Secondary | ICD-10-CM

## 2016-12-27 DIAGNOSIS — Z23 Encounter for immunization: Secondary | ICD-10-CM | POA: Diagnosis not present

## 2016-12-27 DIAGNOSIS — E782 Mixed hyperlipidemia: Secondary | ICD-10-CM | POA: Diagnosis not present

## 2016-12-27 LAB — COMPREHENSIVE METABOLIC PANEL
ALK PHOS: 72 U/L (ref 39–117)
ALT: 61 U/L — ABNORMAL HIGH (ref 0–53)
AST: 65 U/L — ABNORMAL HIGH (ref 0–37)
Albumin: 4.8 g/dL (ref 3.5–5.2)
BUN: 13 mg/dL (ref 6–23)
CHLORIDE: 101 meq/L (ref 96–112)
CO2: 29 mEq/L (ref 19–32)
Calcium: 9.6 mg/dL (ref 8.4–10.5)
Creatinine, Ser: 1.16 mg/dL (ref 0.40–1.50)
GFR: 71.81 mL/min (ref >60.00)
GLUCOSE: 102 mg/dL — AB (ref 70–99)
POTASSIUM: 4.5 meq/L (ref 3.5–5.1)
SODIUM: 137 meq/L (ref 135–145)
TOTAL PROTEIN: 7.8 g/dL (ref 6.0–8.3)
Total Bilirubin: 1.1 mg/dL (ref 0.2–1.2)

## 2016-12-27 LAB — LIPID PANEL
Cholesterol: 207 mg/dL — ABNORMAL HIGH (ref 0–200)
HDL: 43.9 mg/dL (ref >39.00)
LDL CALC: 144 mg/dL — AB (ref 0–99)
NONHDL: 163.45
Total CHOL/HDL Ratio: 5
Triglycerides: 95 mg/dL (ref 0.0–149.0)
VLDL: 19 mg/dL (ref 0.0–40.0)

## 2016-12-27 LAB — CBC
HEMATOCRIT: 46.8 % (ref 39.0–52.0)
Hemoglobin: 15.9 g/dL (ref 13.0–17.0)
MCHC: 33.9 g/dL (ref 30.0–36.0)
MCV: 88.5 fl (ref 78.0–100.0)
Platelets: 299 10*3/uL (ref 150.0–400.0)
RBC: 5.29 Mil/uL (ref 4.22–5.81)
RDW: 13.6 % (ref 11.5–15.5)
WBC: 5.4 10*3/uL (ref 4.0–10.5)

## 2016-12-27 LAB — PSA: PSA: 2.66 ng/mL (ref 0.10–4.00)
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
## 2016-12-27 NOTE — Patient Instructions (Addendum)
Flu shot today  [place] job with weight loss- keep it up!

## 2016-12-27 NOTE — Addendum Note (Signed)
Addended by: Lyndle Herrlich on: 12/27/2016 09:02 AM   Modules accepted: Orders

## 2016-12-28 ENCOUNTER — Other Ambulatory Visit: Payer: Self-pay

## 2016-12-28 ENCOUNTER — Encounter: Payer: Self-pay | Admitting: Family Medicine

## 2016-12-28 DIAGNOSIS — R7989 Other specified abnormal findings of blood chemistry: Secondary | ICD-10-CM

## 2016-12-28 DIAGNOSIS — R945 Abnormal results of liver function studies: Principal | ICD-10-CM

## 2017-01-03 ENCOUNTER — Ambulatory Visit: Payer: Self-pay

## 2017-01-03 NOTE — Telephone Encounter (Signed)
Agree with assessment that he needs to be seen today or go to ED.  Algis Greenhouse. Jerline Pain, MD 01/03/2017 12:28 PM

## 2017-01-03 NOTE — Telephone Encounter (Signed)
See note

## 2017-01-03 NOTE — Telephone Encounter (Signed)
Phone call from pt. with report of several complaints.  Stated for about 4 days he has had intermittent numbness of left hand, "like pins and needles.", and an intermittent "numb or tense feeling over my left chest, from the nipple to the side of my left chest."  Also reported he will get a "sharp pain in the middle of the chest that will come and go quickly."  Stated the discomfort is not present now.  Denied shortness of breath.  Denied nausea.  Reported that he has been under a lot of stress recently, and questioned if his symptoms are due to that.  Reported he has an anxiety problem. Stated he "feels tense in the left rib, neck and shoulder area."  Reported that he doesn't feel good today, and had difficulty falling asleep last night.  Also, reported he has had an increase in gas, with belching and flatulence recently.  Stated he has IBS, and had a diarrhea stool this morning.  Advised pt. that his symptoms are falling between the protocol guidelines of going to the ER and seeing an MD within 24 hours.  The pt. declined going to the ER; stated "I think the ER would be overkill."   Appt. Scheduled 12/28; very clearly advised pt. Is his symptoms worsen then he should go to the ER, and not delay treatment.  Verb. Understanding.            Reason for Disposition . [1] Chest pain lasts > 5 minutes AND [2] occurred > 3 days ago (72 hours) AND [3] NO chest pain or cardiac symptoms now  Answer Assessment - Initial Assessment Questions 1. LOCATION: "Where does it hurt?"       C/o "numb or tense feeling" over left chest area with intermittent sharp, quick pain in center of chest.  Also feels like left hand is falling asleep "like pins and needles." 2. RADIATION: "Does the pain go anywhere else?" (e.g., into neck, jaw, arms, back)     Sometimes feels a tense feeling in the left side of the neck 3. ONSET: "When did the chest pain begin?" (Minutes, hours or days)      4 days ago with increase in symptoms last 2  days  4. PATTERN "Does the pain come and go, or has it been constant since it started?"  "Does it get worse with exertion?"     Comes and goes.   5. DURATION: "How long does it last" (e.g., seconds, minutes, hours)    The left hand sx's. lasts about a few minutes.  Chest symptoms comes and goes with the hand symptoms 6. SEVERITY: "How bad is the pain?"  (e.g., Scale 1-10; mild, moderate, or severe)    - MILD (1-3): doesn't interfere with normal activities     - MODERATE (4-7): interferes with normal activities or awakens from sleep    - SEVERE (8-10): excruciating pain, unable to do any normal activities       Chest mild to moderate; the hand feeling is mild 7. CARDIAC RISK FACTORS: "Do you have any history of heart problems or risk factors for heart disease?" (e.g., prior heart attack, angina; high blood pressure, diabetes, being overweight, high cholesterol, smoking, or strong family history of heart disease)     Denied cardiac hx for himself, no HTN, no Diabetes, slightly overweight   8. PULMONARY RISK FACTORS: "Do you have any history of lung disease?"  (e.g., blood clots in lung, asthma, emphysema, birth control pills)  no 9. CAUSE: "What do you think is causing the chest pain?"     Has been under increased stress recently 10. OTHER SYMPTOMS: "Do you have any other symptoms?" (e.g., dizziness, nausea, vomiting, sweating, fever, difficulty breathing, cough)       Hands sweaty, very tired, don't feel very well. Denies shortness of breath.    11. PREGNANCY: "Is there any chance you are pregnant?" "When was your last menstrual period?"       n/a  Protocols used: CHEST PAIN-A-AH

## 2017-01-03 NOTE — Telephone Encounter (Signed)
Patient is scheduled for a visit on 01/04/2017.  He has been advised to go to ED immediately if symptoms worsen.

## 2017-01-03 NOTE — Telephone Encounter (Signed)
FYI

## 2017-01-04 ENCOUNTER — Encounter: Payer: Self-pay | Admitting: Family Medicine

## 2017-01-04 ENCOUNTER — Ambulatory Visit (INDEPENDENT_AMBULATORY_CARE_PROVIDER_SITE_OTHER): Payer: BLUE CROSS/BLUE SHIELD | Admitting: Family Medicine

## 2017-01-04 VITALS — BP 126/80 | HR 87 | Temp 98.0°F | Ht 72.0 in | Wt 207.4 lb

## 2017-01-04 DIAGNOSIS — R079 Chest pain, unspecified: Secondary | ICD-10-CM | POA: Diagnosis not present

## 2017-01-04 NOTE — Progress Notes (Signed)
   Subjective:  Stephen Bridges is a 46 y.o. male who presents today with a chief complaint of chest pain.   HPI:  Chest Pain, acute issue Started 3-4 days ago.  Has been stable over that time.  Associated symptoms include tingling to his left hand and arm.  Also with some neck pain and tingling.  No shortness of breath.  No fevers or chills.  He has chronic cough which is at his baseline.  No obvious precipitating events, however does note significant stressors at home and at work over the past several weeks.  Symptoms occur randomly.  Described as a sharp shooting pain in the middle and left side of his chest.  The pain will last for 5-10 seconds and then subside.  Symptoms are nonexertional.  ROS: Per HPI  PMH: Smoking history reviewed.  Never smoker.  Father had heart disease.  Objective:  Physical Exam: BP 126/80 (BP Location: Left Arm, Patient Position: Sitting, Cuff Size: Normal)   Pulse 87   Temp 98 F (36.7 C) (Oral)   Ht 6' (1.829 m)   Wt 207 lb 6.4 oz (94.1 kg)   SpO2 98%   BMI 28.13 kg/m   Gen: NAD, resting comfortably CV: RRR with no murmurs appreciated Pulm: NWOB, CTAB with no crackles, wheezes, or rhonchi MSK: -Left upper extremity: No deformities.  Full range of motion.  Tinel's test and Phalen's test negative.  Strength 5 out of 5 throughout.  Sensation intact throughout. -Neck: No deformities.  Full range of motion.  Spurling negative on left.  EKG: Normal sinus rhythm.  No ischemic changes.  Assessment/Plan:  Chest pain Overall, symptoms and exam are reassuring.  Chest pain is atypical in nature.  His physical exam is normal and his EKG does not have any ischemic changes.  However, he does have risk factors including hyperlipidemia and family history that elevated his risk of cardiac disease.  We will send him to have a stress test to rule out coronary artery disease.  Symptoms most likely stress related.  Discussed strict return precautions including shortness of  breath and/or chest pain that persists despite rest.    Discussed ways to manage stress.  Discussed other treatment options including referral to behavioral therapy and pharmacological therapy pending results of above.  He will follow-up with his PCP to discuss this further.  Left arm tingling Likely related to above.  Spurling negative -doubt cervical radiculopathy.  He may have mild carpal tunnel syndrome.  Will avoid anti-inflammatories until cardiac workup is completed.  Advised patient that he should look into wearing a brace on his left wrist to see if this helps with his symptoms.  Time Spent: I spent 25 minutes face-to-face with the patient, with more than half spent on counseling for the etiology and treatment for his chest pain and arm numbness/tingling, as well as ways to manage stress.   Algis Greenhouse. Jerline Pain, MD 01/04/2017 9:29 AM

## 2017-01-04 NOTE — Patient Instructions (Signed)
Your exam and symptoms are reassuring. Your EKG is also normal.  We will send you to have a stress test.  Please continue with the healthy diet and regular exercise.  Take care, Dr Jerline Pain

## 2017-01-10 ENCOUNTER — Encounter: Payer: Self-pay | Admitting: Family Medicine

## 2017-01-22 ENCOUNTER — Encounter: Payer: Self-pay | Admitting: Family Medicine

## 2017-01-28 ENCOUNTER — Other Ambulatory Visit: Payer: BLUE CROSS/BLUE SHIELD

## 2017-01-31 ENCOUNTER — Other Ambulatory Visit (INDEPENDENT_AMBULATORY_CARE_PROVIDER_SITE_OTHER): Payer: BLUE CROSS/BLUE SHIELD

## 2017-01-31 ENCOUNTER — Ambulatory Visit (INDEPENDENT_AMBULATORY_CARE_PROVIDER_SITE_OTHER): Payer: BLUE CROSS/BLUE SHIELD | Admitting: Family Medicine

## 2017-01-31 ENCOUNTER — Encounter: Payer: Self-pay | Admitting: Family Medicine

## 2017-01-31 VITALS — BP 118/70 | HR 93 | Temp 98.7°F | Ht 72.0 in | Wt 197.2 lb

## 2017-01-31 DIAGNOSIS — R7989 Other specified abnormal findings of blood chemistry: Secondary | ICD-10-CM

## 2017-01-31 DIAGNOSIS — R945 Abnormal results of liver function studies: Secondary | ICD-10-CM

## 2017-01-31 DIAGNOSIS — H6592 Unspecified nonsuppurative otitis media, left ear: Secondary | ICD-10-CM | POA: Diagnosis not present

## 2017-01-31 LAB — HEPATIC FUNCTION PANEL
ALK PHOS: 69 U/L (ref 39–117)
ALT: 29 U/L (ref 0–53)
AST: 25 U/L (ref 0–37)
Albumin: 4.6 g/dL (ref 3.5–5.2)
Bilirubin, Direct: 0.1 mg/dL (ref 0.0–0.3)
Total Bilirubin: 0.6 mg/dL (ref 0.2–1.2)
Total Protein: 7.2 g/dL (ref 6.0–8.3)

## 2017-01-31 MED ORDER — FLUTICASONE PROPIONATE 50 MCG/ACT NA SUSP: 2.0000 | Freq: Every day | NASAL | 0 refills | Status: DC

## 2017-01-31 NOTE — Progress Notes (Signed)
PCP: Marin Olp, MD  Subjective:  Stephen Bridges is a 47 y.o. year old very pleasant male patient who presents with left ear pain and other symptoms including nasal congestion, some chest congestion but improving, cough, but main symptom has been left ear pain. He had chills on Monday in 70 degree weather in [place] -started: Monday, symptoms are improving other than the left ear pain. He put ear plugs in with 2 plane flights and was wondering if he hurt his eardrum -previous treatments: rest, hydration.  -sick contacts/travel/risks: denies flu exposure.   ROS-denies fever, SOB, NVD, tooth pain  Pertinent Past Medical History-  Patient Active Problem List   Diagnosis Date Noted  . History of adenomatous polyp of colon 10/03/2016    Priority: Medium  . Ocular migraine 12/27/2015    Priority: Medium  . HYPERLIPIDEMIA 11/04/2006    Priority: Medium  . Chronic cough 06/12/2012    Priority: Low  . Lipoma 06/12/2012    Priority: Low  . ALLERGIC RHINITIS 11/04/2006    Priority: Low  . GERD 11/04/2006    Priority: Low   Medications- reviewed  Current Outpatient Medications  Medication Sig Dispense Refill  . Multiple Vitamin (MULTIVITAMIN) tablet Take 1 tablet by mouth daily.    . Omega-3 Fatty Acids (FISH OIL) 1000 MG CAPS Take 1 capsule by mouth daily.    . Probiotic Product (PROBIOTIC PO) Take 1 capsule by mouth daily.    . fluticasone (FLONASE) 50 MCG/ACT nasal spray Place 2 sprays into both nostrils daily. 16 g 0   Objective: BP 118/70 (BP Location: Left Arm, Patient Position: Sitting, Cuff Size: Large)   Pulse 93   Temp 98.7 F (37.1 C) (Oral)   Ht 6' (1.829 m)   Wt 197 lb 3.2 oz (89.4 kg)   SpO2 98%   BMI 26.75 kg/m  Gen: NAD, appears fatigued HEENT: Turbinates erythematous, TM normal on right- on left slight gray appearance and appears to be slightly bulging, pharynx mildly erythematous with no tonsilar exudate or edema, no sinus tenderness CV: RRR  Lungs: CTAB  no obvious crackles, wheeze, rhonchi Ext: no edema Skin: warm, dry, no rash  Assessment/Plan:  Upper Respiratory infection History and exam today are suggestive of viral infection most likely due to upper respiratory infection. URI is improving and does not require intervention  Otitis media with effusion suspect with URI he also developed sinus congestion and resultant fluid in left ear causing his symptoms of ear fullness and some pressure. I do not see acute otitis media (no erythema of tympanic membrane). I still told patient that this could develop though and if he is having worsening ear pain or symptoms not improving over time we need to reevaluate. From avs "You do have fluid in your ear it appears- your membrane appears to be bulging slightly.   Try flonase for at least 2-3 weeks, try zyrtec at night perhaps half a tab if makes you too sleepy, try decongestant like sudafed for 3-5 days. If this is worsening we can always refer you to ENT for their opinion but often this can last up to 2-3 months- Im hoping doing the above will shorten this- especially since this started with an upper respiratory infection and probably inflammation from this. "   Finally, we reviewed reasons to return to care including if symptoms worsen or persist or new concerns arise- once again particularly shortness of breath or fever.  Meds ordered this encounter  Medications  . fluticasone (  FLONASE) 50 MCG/ACT nasal spray    Sig: Place 2 sprays into both nostrils daily.    Dispense:  16 g    Refill:  0    Garret Reddish, MD

## 2017-01-31 NOTE — Patient Instructions (Addendum)
You do have fluid in your ear it appears- your membrane appears to be bulging slightly.   Try flonase for at least 2-3 weeks, try zyrtec at night perhaps half a tab if makes you too sleepy, try decongestant like sudafed for 3-5 days. If this is worsening we can always refer you to ENT for their opinion but often this can last up to 2-3 months- Im hoping doing the above will shorten this- especially since this started with an upper respiratory infection and probably inflammation from this.    Otitis Media With Effusion, Pediatric Otitis media with effusion (OME) occurs when there is inflammation of the middle ear and fluid in the middle ear space. There are no signs and symptoms of infection. The middle ear space contains air and the bones for hearing. Air in the middle ear space helps to transmit sound to the brain. OME is a common condition in children, and it often occurs after an ear infection. This condition may be present for several weeks or longer after an ear infection. Most cases of this condition get better on their own. What are the causes? OME is caused by a blockage of the eustachian tube in one or both ears. These tubes drain fluid in the ears to the back of the nose (nasopharynx). If the tissue in the tube swells up (edema), the tube closes. This prevents fluid from draining. Blockage can be caused by:  Ear infections.  Colds and other upper respiratory infections.  Allergies.  Irritants, such as tobacco smoke.  Enlarged adenoids. The adenoids are areas of soft tissue located high in the back of the throat, behind the nose and the roof of the mouth. They are part of the body's natural defense (immune) system.  A mass in the nasopharynx.  Damage to the ear caused by pressure changes (barotrauma).  What increases the risk? Your child is more likely to develop this condition if:  He or she has repeated ear and sinus infections.  He or she has allergies.  He or she is  exposed to tobacco smoke.  He or she attends daycare.  He or she is not breastfed.  What are the signs or symptoms? Symptoms of this condition may not be obvious. Sometimes this condition does not have any symptoms, or symptoms may overlap with those of a cold or upper respiratory tract illness. Symptoms of this condition include:  Temporary hearing loss.  A feeling of fullness in the ear without pain.  Irritability or agitation.  Balance (vestibular) problems.  As a result of hearing loss, your child may:  Listen to the TV at a loud volume.  Not respond to questions.  Ask "What?" often when spoken to.  Mistake or confuse one sound or word for another.  Perform poorly at school.  Have a poor attention span.  Become agitated or irritated easily.  How is this diagnosed? This condition is diagnosed with an ear exam. Your child's health care provider will look inside your child's ear with an instrument (otoscope) to check for redness, swelling, and fluid. Other tests may be done, including:  A test to check the movement of the eardrum (pneumatic otoscopy). This is done by squeezing a small amount of air into the ear.  A test that changes air pressure in the middle ear to check how well the eardrum moves and to see if the eustachian tube is working (tympanogram).  Hearing test (audiogram). This test involves playing tones at different pitches to see  if your child can hear each tone.  How is this treated? Treatment for this condition depends on the cause. In many cases, the fluid goes away on its own. In some cases, your child may need a procedure to create a hole in the eardrum to allow fluid to drain (myringotomy) and to insert small drainage tubes (tympanostomy tubes) into the eardrums. These tubes help to drain fluid and prevent infection. This procedure may be recommended if:  OME does not get better over several months.  Your child has many ear infections within  several months.  Your child has noticeable hearing loss.  Your child has problems with speech and language development.  Surgery may also be done to remove the adenoids (adenoidectomy). Follow these instructions at home:  Give over-the-counter and prescription medicines only as told by your child's health care provider.  Keep children away from any tobacco smoke.  Keep all follow-up visits as told by your child's health care provider. This is important. How is this prevented?  Keep your child's vaccinations up to date. Make sure your child gets all recommended vaccinations, including a pneumonia and flu vaccine.  Encourage hand washing. Your child should wash his or her hands often with soap and water. If there is no soap and water, he or she should use hand sanitizer.  Avoid exposing your child to tobacco smoke.  Breastfeed your baby, if possible. Babies who are breastfed as long as possible are less likely to develop this condition. Contact a health care provider if:  Your child's hearing does not get better after 3 months.  Your child's hearing is worse.  Your child has ear pain.  Your child has a fever.  Your child has drainage from the ear.  Your child is dizzy.  Your child has a lump on his or her neck. Get help right away if:  Your child has bleeding from the nose.  Your child cannot move part of her or his face.  Your child has trouble breathing.  Your child cannot smell.  Your child develops severe congestion.  Your child develops weakness.  Your child who is younger than 3 months has a temperature of 100F (38C) or higher. Summary  Otitis media with effusion (OME) occurs when there is inflammation of the middle ear and fluid in the middle ear space.  This condition is caused by blockage of one or both eustachian tubes, which drain fluid in the ears to the back of the nose.  Symptoms of this condition can include temporary hearing loss, a feeling  of fullness in the ear, irritability or agitation, and balance (vestibular) problems. Sometimes, there are no symptoms.  This condition is diagnosed with an ear exam and tests, such as pneumatic otoscopy, tympanogram, and audiogram.  Treatment for this condition depends on the cause. In many cases, the fluid goes away on its own. This information is not intended to replace advice given to you by your health care provider. Make sure you discuss any questions you have with your health care provider. Document Released: 03/17/2003 Document Revised: 11/17/2015 Document Reviewed: 11/17/2015 Elsevier Interactive Patient Education  2017 [employer].

## 2017-02-02 ENCOUNTER — Encounter: Payer: Self-pay | Admitting: Family Medicine

## 2017-02-02 DIAGNOSIS — H6590 Unspecified nonsuppurative otitis media, unspecified ear: Secondary | ICD-10-CM | POA: Insufficient documentation

## 2017-02-02 NOTE — Assessment & Plan Note (Signed)
suspect with URI he also developed sinus congestion and resultant fluid in left ear causing his symptoms of ear fullness and some pressure. I do not see acute otitis media (no erythema of tympanic membrane). I still told patient that this could develop though and if he is having worsening ear pain or symptoms not improving over time we need to reevaluate. From avs "You do have fluid in your ear it appears- your membrane appears to be bulging slightly.   Try flonase for at least 2-3 weeks, try zyrtec at night perhaps half a tab if makes you too sleepy, try decongestant like sudafed for 3-5 days. If this is worsening we can always refer you to ENT for their opinion but often this can last up to 2-3 months- Im hoping doing the above will shorten this- especially since this started with an upper respiratory infection and probably inflammation from this. "

## 2017-02-06 ENCOUNTER — Ambulatory Visit (INDEPENDENT_AMBULATORY_CARE_PROVIDER_SITE_OTHER): Payer: BLUE CROSS/BLUE SHIELD

## 2017-02-06 DIAGNOSIS — R079 Chest pain, unspecified: Secondary | ICD-10-CM

## 2017-02-06 LAB — EXERCISE TOLERANCE TEST
CSEPED: 11 min
CSEPEW: 13.4 METS
CSEPHR: 102 %
Exercise duration (sec): 0 s
MPHR: 174 {beats}/min
Peak HR: 179 {beats}/min
RPE: 16
Rest HR: 78 {beats}/min

## 2017-02-06 NOTE — Progress Notes (Signed)
Dr Marigene Ehlers interpretation of stress test:  Good news! Your stress test was negative. The chest pain you have been experiencing is not coming from your heart. Please continue working on ways to reduce stress and follow up with myself or Dr Yong Channel if you continue to have bothersome symptoms.   If you have any additional questions, please give [place] a call or send [place] a message through [facility].  Take care, Dr Jerline Pain

## 2017-03-11 DIAGNOSIS — H5310 Unspecified subjective visual disturbances: Secondary | ICD-10-CM | POA: Diagnosis not present

## 2017-03-13 ENCOUNTER — Encounter: Payer: Self-pay | Admitting: Family Medicine

## 2017-03-18 DIAGNOSIS — R972 Elevated prostate specific antigen [PSA]: Secondary | ICD-10-CM | POA: Diagnosis not present

## 2017-03-18 LAB — PSA: PSA: 1.95

## 2017-03-25 DIAGNOSIS — R350 Frequency of micturition: Secondary | ICD-10-CM | POA: Diagnosis not present

## 2017-03-25 DIAGNOSIS — R972 Elevated prostate specific antigen [PSA]: Secondary | ICD-10-CM | POA: Diagnosis not present

## 2017-04-01 ENCOUNTER — Encounter: Payer: Self-pay | Admitting: Family Medicine

## 2017-10-08 ENCOUNTER — Ambulatory Visit (INDEPENDENT_AMBULATORY_CARE_PROVIDER_SITE_OTHER): Payer: BLUE CROSS/BLUE SHIELD | Admitting: Family Medicine

## 2017-10-08 ENCOUNTER — Encounter: Payer: Self-pay | Admitting: Family Medicine

## 2017-10-08 VITALS — BP 122/82 | HR 63 | Temp 97.9°F | Ht 72.0 in | Wt 186.6 lb

## 2017-10-08 DIAGNOSIS — F411 Generalized anxiety disorder: Secondary | ICD-10-CM | POA: Insufficient documentation

## 2017-10-08 DIAGNOSIS — Z23 Encounter for immunization: Secondary | ICD-10-CM

## 2017-10-08 DIAGNOSIS — R0789 Other chest pain: Secondary | ICD-10-CM

## 2017-10-08 DIAGNOSIS — R079 Chest pain, unspecified: Secondary | ICD-10-CM

## 2017-10-08 MED ORDER — PANTOPRAZOLE SODIUM 20 MG PO TBEC: 20.0000 mg | DELAYED_RELEASE_TABLET | Freq: Every day | ORAL | 1 refills | Status: DC

## 2017-10-08 NOTE — Patient Instructions (Addendum)
Health Maintenance Due  Topic Date Due  . INFLUENZA VACCINE -given today in office 08/08/2017   Possible reflux- trial pantoprazole- stop and let us know if it causes rash  Possible anxiety- elevated anxiety score today- lets try adding counseling. We can also consider medication for this but want to start with counseling and working up hard first  Out of abundance of precaution will refer to cardiology. We will call you within two weeks about your referral to cardiology. If you do not hear within 3 weeks, give [place] a call.   If you have new or worsening symptoms- please seek care immediately particularly worsening chest pain (or if it every happens with exertion) or if its associated with shortness of breath  Suspect carpal tunnel- try cock up wrist splint on left hand everynight for 2 weeks. If heart work up is clear may try antiinflammatory- may also refer to Dr. Paulla Fore of sports medicine to consider injection or further workup

## 2017-10-08 NOTE — Progress Notes (Signed)
Subjective:  Stephen Bridges is a 47 y.o. year old very pleasant male patient who presents for/with See problem oriented charting ROS- chest pain intermittent, no shortness of breath. Some excess belching. No fever or chills. No edema.    Past Medical History-  Patient Active Problem List   Diagnosis Date Noted  . GAD (generalized anxiety disorder) 10/08/2017    Priority: Medium  . History of adenomatous polyp of colon 10/03/2016    Priority: Medium  . Ocular migraine 12/27/2015    Priority: Medium  . HYPERLIPIDEMIA 11/04/2006    Priority: Medium  . Atypical chest pain 11/04/2006    Priority: Medium  . Chronic cough 06/12/2012    Priority: Low  . Lipoma 06/12/2012    Priority: Low  . ALLERGIC RHINITIS 11/04/2006    Priority: Low  . GERD 11/04/2006    Priority: Low  . Otitis media with effusion 02/02/2017    Medications- reviewed and updated Current Outpatient Medications  Medication Sig Dispense Refill  . Multiple Vitamin (MULTIVITAMIN) tablet Take 1 tablet by mouth daily.    . Omega-3 Fatty Acids (FISH OIL) 1000 MG CAPS Take 1 capsule by mouth daily.    . Probiotic Product (PROBIOTIC PO) Take 1 capsule by mouth daily.    . pantoprazole (PROTONIX) 20 MG tablet Take 1 tablet (20 mg total) by mouth daily. 30 tablet 1   No current facility-administered medications for this visit.     Objective: BP 122/82 (BP Location: Left Arm, Patient Position: Sitting, Cuff Size: Normal)   Pulse 63   Temp 97.9 F (36.6 C) (Oral)   Ht 6' (1.829 m)   Wt 186 lb 9.6 oz (84.6 kg)   SpO2 100%   BMI 25.31 kg/m  Gen: NAD, resting comfortably CV: RRR no murmurs rubs or gallops No chest wall pain Lungs: CTAB no crackles, wheeze, rhonchi Abdomen: soft/nontender/nondistended/normal bowel sounds.  Ext: no edema Skin: warm, dry Neuro: positive Tinel and Phalens but oddly also has numbness sensation in 4th and fifth fingers as well with this. Negative spurlings test.   EKG: sinus bradycardia  with rate 57, normal axis, normal intervals, no hypertrophy, no st or t wave chages. Largely stable compare to 01/04/17 EKG  Assessment/Plan:  Atypical chest pain S: for last few weeks has been noticing numbness into left hand- numbness tends to always be there- 1st three fingers of right hand more pronounced- also feels in bottom 2. Similar problem late 2018- hasnt tried wrist brace. Years of hand issues- comes and goes , but this is the first time has lined up with chest pain as below  Along with hand numbness, has  also noted some pain in left chest. Would get some short non sustained sharp pains in left chest as well as pressure in central chest at times. Has been belching a lot more lately. Feels like notices fluids going all the way down while swallowing. Has flatulence but not more than normal. Got rid of coffee and went to tea- peppermint tea- wonders if reflux is the cause- read that peppermint can agitate reflux.  Had prior cardiac workup due to chest pain late last year and then this year. POET exercise tolerance test- 02/06/17 reassuring. No family history of heart disease.   Also with some left sided neck pain for 3-4 days. Also will get occasional pains into left shoulder in this time. Feels a general tenseness in chest and overall- a lot of change happening at work right now and is stressful  time. Pain is actually better with exercise. Actually feels tension worsening as talks about it. When he thinks about work- pain worsens.  A/P: Strongly suspect GERD or anxiety as cause of pain. For GERD- will treat with pantoprazole as lon as no rash (has rash on omeprazole in past). For anxiety- advised CBT- may consider other medications at later date. See GAD section  EKG largely reassuring. No pain with exercise and reports pain actually less notable when exercise- strongly doubt cardiac but with continued issues will refer to cardiology for their opinion on : no further testing vs. Myocardial  perfusion study vs. Coronary CT vs. Stress echocardiogram.   GAD (generalized anxiety disorder) GAD7 elevated above 10 today. Suspect GAD as cause of anxiety as well as chest pain. Will trial CBT- consider SSRI in future if not improving.    Future Appointments  Date Time Provider [place]  12/27/2017  9:30 AM Marin Olp, MD [facility]   Return in about 4 weeks (around 11/05/2017) for follow up- or sooner if needed.  Lab/Order associations: Chest pain, unspecified type - Plan: EKG 12-Lead, Ambulatory referral to Cardiology  Need for prophylactic vaccination and inoculation against influenza - Plan: Flu Vaccine QUAD 36+ mos IM  Atypical chest pain  GAD (generalized anxiety disorder)  Meds ordered this encounter  Medications  . pantoprazole (PROTONIX) 20 MG tablet    Sig: Take 1 tablet (20 mg total) by mouth daily.    Dispense:  30 tablet    Refill:  1   Return precautions advised.  Garret Reddish, MD

## 2017-10-08 NOTE — Assessment & Plan Note (Signed)
S: for last few weeks has been noticing numbness into left hand- numbness tends to always be there- 1st three fingers of right hand more pronounced- also feels in bottom 2. Similar problem late 2018- hasnt tried wrist brace. Years of hand issues- comes and goes , but this is the first time has lined up with chest pain as below  Along with hand numbness, has  also noted some pain in left chest. Would get some short non sustained sharp pains in left chest as well as pressure in central chest at times. Has been belching a lot more lately. Feels like notices fluids going all the way down while swallowing. Has flatulence but not more than normal. Got rid of coffee and went to tea- peppermint tea- wonders if reflux is the cause- read that peppermint can agitate reflux.  Had prior cardiac workup due to chest pain late last year and then this year. POET exercise tolerance test- 02/06/17 reassuring. No family history of heart disease.   Also with some left sided neck pain for 3-4 days. Also will get occasional pains into left shoulder in this time. Feels a general tenseness in chest and overall- a lot of change happening at work right now and is stressful time. Pain is actually better with exercise. Actually feels tension worsening as talks about it. When he thinks about work- pain worsens.  A/P: Strongly suspect GERD or anxiety as cause of pain. For GERD- will treat with pantoprazole as lon as no rash (has rash on omeprazole in past). For anxiety- advised CBT- may consider other medications at later date. See GAD section  EKG largely reassuring. No pain with exercise and reports pain actually less notable when exercise- strongly doubt cardiac but with continued issues will refer to cardiology for their opinion on : no further testing vs. Myocardial perfusion study vs. Coronary CT vs. Stress echocardiogram.

## 2017-10-08 NOTE — Assessment & Plan Note (Signed)
GAD7 elevated above 10 today. Suspect GAD as cause of anxiety as well as chest pain. Will trial CBT- consider SSRI in future if not improving.

## 2017-11-22 ENCOUNTER — Encounter: Payer: Self-pay | Admitting: Cardiology

## 2017-11-22 ENCOUNTER — Ambulatory Visit (INDEPENDENT_AMBULATORY_CARE_PROVIDER_SITE_OTHER): Payer: BLUE CROSS/BLUE SHIELD | Admitting: Cardiology

## 2017-11-22 VITALS — BP 116/72 | HR 61 | Ht 72.0 in | Wt 182.0 lb

## 2017-11-22 DIAGNOSIS — E782 Mixed hyperlipidemia: Secondary | ICD-10-CM

## 2017-11-22 DIAGNOSIS — R079 Chest pain, unspecified: Secondary | ICD-10-CM

## 2017-11-22 DIAGNOSIS — Z01812 Encounter for preprocedural laboratory examination: Secondary | ICD-10-CM | POA: Diagnosis not present

## 2017-11-22 DIAGNOSIS — Z7189 Other specified counseling: Secondary | ICD-10-CM

## 2017-11-22 NOTE — Progress Notes (Signed)
Cardiology Office Note:    Date:  11/22/2017   ID:  Stephen Bridges, DOB 11/21/1970, MRN [id number]  PCP:  Marin Olp, MD  Cardiologist:  Buford Dresser, MD PhD  Referring MD: Marin Olp, MD   CC: chest pain  History of Present Illness:    Stephen Bridges is a 47 y.o. male with a hx of allergies, hyperlipidemia, anxiety who is seen as a new consult at the request of Marin Olp, MD for the evaluation and management of chest pain.  Chest pain: -Initial onset: several years at least, possibly longer -Quality: tension usually, sometimes sharp. Left sided, sometimes going to neck. Sometimes with left arm/finger tingling and numbness, though this is not always related to chest discomfort. Sometimes has pain tender to palpation, but this is a different pain. -Frequency: multiple times/day most days, though sometimes on less stressful weeks he can have multiple days without it. -Duration: 5-10 minutes each occurrence -Triggers: stress. Has cut out caffeine. -Aggravating/alleviating factors: worse with stress. No affected by exertion, position, food, time of day.  -Prior cardiac history: high cholesterol, never been on medications. Highest is LDL 177, lowest 143. Totals over 200, highest 240.  -Prior ECG: sinus bradycardia -Prior workup: POET normal 02/06/2017,  -Prior treatment: none -Alcohol: rare -Tobacco: none -Comorbidities: hyperlipidemia -Exercise level: works out regularly, 30 mins cardio most days (elliptical/treadmill) and lifts 2x/week 30-40 mins each time. No pain with working out. -Cardiac ROS: no shortness of breath, no PND, no orthopnea, no LE edema, no syncope -Family history: high cholesterol in father, brother, maternal aunts. [name] with aneurysm, [name] with stroke. Mat Gpa with emphysema, Mat Gma with liver failure. Mom has multiple sclerosis or similar. No early onset, no sudden cardiac death.  Past Medical History:  Diagnosis Date  . Allergy      allergic rhinitis  . HLD (hyperlipidemia)   . Nosebleed    history of nosebleeds as child  . Wheezing    exercise induced    Past Surgical History:  Procedure Laterality Date  . KNEE ARTHROSCOPY Right 1990    wrestling injury  . WISDOM TOOTH EXTRACTION  2014    Current Medications: Current Outpatient Medications on File Prior to Visit  Medication Sig  . Multiple Vitamin (MULTIVITAMIN) tablet Take 1 tablet by mouth daily.  . Omega-3 Fatty Acids (FISH OIL) 1000 MG CAPS Take 1 capsule by mouth daily.   No current facility-administered medications on file prior to visit.      Allergies:   Omeprazole   Social History   Socioeconomic History  . Marital status: Married    Spouse name: Not on file  . Number of children: 2  . Years of education: Not on file  . Highest education level: Not on file  Occupational History  . Occupation: IT  Social Needs  . Financial resource strain: Not on file  . Food insecurity:    Worry: Not on file    Inability: Not on file  . Transportation needs:    Medical: Not on file    Non-medical: Not on file  Tobacco Use  . Smoking status: Never Smoker  . Smokeless tobacco: Never Used  Substance and Sexual Activity  . Alcohol use: Yes    Comment: occasional  . Drug use: No  . Sexual activity: Never    Partners: Female  Lifestyle  . Physical activity:    Days per week: Not on file    Minutes per session: Not  on file  . Stress: Not on file  Relationships  . Social connections:    Talks on phone: Not on file    Gets together: Not on file    Attends religious service: Not on file    Active member of club or organization: Not on file    Attends meetings of clubs or organizations: Not on file    Relationship status: Not on file  Other Topics Concern  . Not on file  Social History Narrative   Married--2 sons (see his wife kim as well) Sons 10 and 7 in 2017.       Occupation: Radio producer (Forensic psychologist) at [employer] History: The patient's family history includes Hypertension in his father; Leukemia in his maternal uncle; Multiple sclerosis (age of onset: 95) in his mother; Multiple sclerosis (age of onset: 48) in his maternal uncle; Prostate cancer in his father; Thyroid disease in his father and paternal grandmother. There is no history of Colon cancer. high cholesterol in father, brother, maternal aunts. [name] with aneurysm, [name] with stroke. Mat Gpa with emphysema, Mat Gma with liver failure. Mom has multiple sclerosis or similar. No early onset, no sudden cardiac death.  ROS:   Please see the history of present illness.  Additional pertinent ROS:  Constitutional: Negative for chills, fever, night sweats, unintentional weight loss  HENT: Negative for ear pain and hearing loss.   Eyes: Negative for loss of vision and eye pain.  Respiratory: Positive for occasional cough. Negative for sputum, shortness of breath, wheezing.   Cardiovascular: Positive for chest pain. Negative for palpitations, PND, orthopnea, lower extremity edema and claudication.  Gastrointestinal: Negative for abdominal pain, melena, and hematochezia.  Genitourinary: Negative for dysuria and hematuria.  Musculoskeletal: Negative for falls and myalgias.  Skin: Negative for itching and rash.  Neurological: Negative for focal weakness, focal sensory changes and loss of consciousness.  Endo/Heme/Allergies: Does not bruise/bleed easily.    EKGs/Labs/Other Studies Reviewed:    The following studies were reviewed today: POET 02/06/17  Blood pressure demonstrated a normal response to exercise.  No T wave inversion was noted during stress.  Upsloping ST segment depression ST segment depression was noted during stress in the V4, V6, V5, II, III and aVF leads, and returning to baseline after less than 1 minute of recovery.  Negative, adequate stress test. Exercise Time  Exercise duration (min) 11 min    Exercise duration  (sec) 0 sec    Peak Stress Vitals  Peak HR 179 bpm    Peak BP 175/81 mmHg    Exercise Data  MPHR 174 bpm    Percent HR 102 %    RPE 16     Estimated workload 13.4 METS      EKG:  EKG is personally reviewed.  The ekg ordered today demonstrates sinus rhythm with PAC  Recent Labs: 12/27/2016: BUN 13; Creatinine, Ser 1.16; Hemoglobin 15.9; Platelets 299.0; Potassium 4.5; Sodium 137 01/31/2017: ALT 29  Recent Lipid Panel    Component Value Date/Time   CHOL 207 (H) 12/27/2016 0857   TRIG 95.0 12/27/2016 0857   HDL 43.90 12/27/2016 0857   CHOLHDL 5 12/27/2016 0857   VLDL 19.0 12/27/2016 0857   LDLCALC 144 (H) 12/27/2016 0857   LDLDIRECT 148.9 10/24/2012 0943    Physical Exam:    VS:  BP 116/72   Pulse 61   Ht 6' (1.829 m)   Wt 182 lb (82.6 kg)   BMI  24.68 kg/m     Wt Readings from Last 3 Encounters:  11/22/17 182 lb (82.6 kg)  10/08/17 186 lb 9.6 oz (84.6 kg)  01/31/17 197 lb 3.2 oz (89.4 kg)     GEN: Well nourished, well developed in no acute distress HEENT: Normal NECK: No JVD; No carotid bruits LYMPHATICS: No lymphadenopathy CARDIAC: regular rhythm, normal S1 and S2, no murmurs, rubs, gallops. Radial and DP pulses 2+ bilaterally. RESPIRATORY:  Clear to auscultation without rales, wheezing or rhonchi  ABDOMEN: Soft, non-tender, non-distended MUSCULOSKELETAL:  No edema; No deformity  SKIN: Warm and dry NEUROLOGIC:  Alert and oriented x 3 PSYCHIATRIC:  Normal affect   ASSESSMENT:    1. Chest pain, unspecified type   2. HYPERLIPIDEMIA   3. Counseling on health promotion and disease prevention   4. Pre-procedure lab exam    PLAN:    1. Chest pain: some atypical components, but with elevated cholesterol, worsening with stress, he has concerning features as well. Negative ETT in January. Given his lipids and symptoms, would prefer definitive testing. Discussed options with patient, plan is for coronary CTA -coronary CTA with BMET 1 week prior. Baseline  heart rate ~60 bpm.  2. Hyperlipidemia: LDL has been consistently elevated, despite excellent exercise. Diet is very good as well, with lots of tree nuts, fruits, vegetables and very little meat. Despite this, his LDL is high, at one time over 170. No early family history of CAD, but this may be a genetic hyperlipidemia. He would like further risk stratification before discussing treatment with statin. -ordered hs-CRP and lp (a) for further information -coronary CTA will show plaque and give calcium score  3. Primary prevention: -recommend heart healthy/Mediterranean diet, with whole grains, fruits, vegetable, fish, lean meats, nuts, and olive oil. Limit salt. -recommend moderate walking, 3-5 times/week for 30-50 minutes each session. Aim for at least 150 minutes.week. Goal should be pace of 3 miles/hours, or walking 1.5 miles in 30 minutes -recommend avoidance of tobacco products. Avoid excess alcohol. -Additional risk factor control:  -Diabetes: A1c is 5.6, no history of diabetes  -Lipids: as above  -Blood pressure control: at goal, on no meds  -Weight: BMI 24 -ASCVD risk score: The 10-year ASCVD risk score Mikey Bussing DC Brooke Bonito., et al., 2013) is: 2.6%   Values used to calculate the score:     Age: 15 years     Sex: Male     Is Non-Hispanic African American: No     Diabetic: No     Tobacco smoker: No     Systolic Blood Pressure: 767 mmHg     Is BP treated: No     HDL Cholesterol: 43.9 mg/dL     Total Cholesterol: 207 mg/dL   TIME SPENT WITH PATIENT: >60 minutes of direct patient care. More than 50% of that time was spent on coordination of care and counseling regarding symptoms, cardiovascular risk, dietary and lifestyle recommendations.  Plan for follow up: 1 year if testing unremarkable, sooner PRN  Medication Adjustments/Labs and Tests Ordered: Current medicines are reviewed at length with the patient today.  Concerns regarding medicines are outlined above.  Orders Placed This  Encounter  Procedures  . CT CORONARY MORPH W/CTA COR W/SCORE W/CA W/CM &/OR WO/CM  . CT CORONARY FRACTIONAL FLOW RESERVE DATA PREP  . CT CORONARY FRACTIONAL FLOW RESERVE FLUID ANALYSIS  . Basic metabolic panel  . Lipid panel  . High sensitivity CRP  . Lipoprotein A (LPA)  . EKG 12-Lead   No orders  of the defined types were placed in this encounter.   Patient Instructions  Medication Instructions:  Your Physician recommend you continue on your current medication as directed.    If you need a refill on your cardiac medications before your next appointment, please call your pharmacy.   Lab work: Your physician recommends that you return for lab work in one week prior to test (BMP, lipid, CRP, LPA)  If you have labs (blood work) drawn today and your tests are completely normal, you will receive your results only by: Marland Kitchen MyChart Message (if you have MyChart) OR . A paper copy in the mail If you have any lab test that is abnormal or we need to change your treatment, we will call you to review the results.  Testing/Procedures: Your physician has requested that you have cardiac CT. Cardiac computed tomography (CT) is a painless test that uses an x-ray machine to take clear, detailed pictures of your heart. For further information please visit HugeFiesta.tn. Please follow instruction sheet as given.  [facility]   Follow-Up: At [facility], you and your health needs are our priority.  As part of our continuing mission to provide you with exceptional heart care, we have created designated Provider Care Teams.  These Care Teams include your primary Cardiologist (physician) and Advanced Practice Providers (APPs -  Physician Assistants and Nurse Practitioners) who all work together to provide you with the care you need, when you need it. You will need a follow up appointment in 1 years.  Please call our office 2 months in advance to schedule this appointment.  You may see Dr.  Harrell Gave or one of the following Advanced Practice Providers on your designated Care Team:   Rosaria Ferries, PA-C . Jory Sims, DNP, ANP  Any Other Special Instructions Will Be Listed Below (If Applicable).  Please arrive at the [facility] main entrance of [facility] at xx:xx AM (30-45 minutes prior to test start time)  [facility] [place], [place] [address] [phone]  Proceed to the [facility] Radiology Department (First Floor).  Please follow these instructions carefully (unless otherwise directed):  Hold all erectile dysfunction medications at least 48 hours prior to test.  On the Night Before the Test: . Be sure to Drink plenty of water. . Do not consume any caffeinated/decaffeinated beverages or chocolate 12 hours prior to your test. . Do not take any antihistamines 12 hours prior to your test.  On the Day of the Test: . Drink plenty of water. Do not drink any water within one hour of the test. . Do not eat any food 4 hours prior to the test. . You may take your regular medications prior to the test.        After the Test: . Drink plenty of water. . After receiving IV contrast, you may experience a mild flushed feeling. This is normal. . On occasion, you may experience a mild rash up to 24 hours after the test. This is not dangerous. If this occurs, you can take Benadryl 25 mg and increase your fluid intake. . If you experience trouble breathing, this can be serious. If it is severe call 911 IMMEDIATELY. If it is mild, please call our office.        Signed, Buford Dresser, MD PhD 11/22/2017 12:49 PM    [place]

## 2017-11-22 NOTE — Patient Instructions (Signed)
Medication Instructions:  Your Physician recommend you continue on your current medication as directed.    If you need a refill on your cardiac medications before your next appointment, please call your pharmacy.   Lab work: Your physician recommends that you return for lab work in one week prior to test (BMP, lipid, CRP, LPA)  If you have labs (blood work) drawn today and your tests are completely normal, you will receive your results only by: Marland Kitchen MyChart Message (if you have MyChart) OR . A paper copy in the mail If you have any lab test that is abnormal or we need to change your treatment, we will call you to review the results.  Testing/Procedures: Your physician has requested that you have cardiac CT. Cardiac computed tomography (CT) is a painless test that uses an x-ray machine to take clear, detailed pictures of your heart. For further information please visit HugeFiesta.tn. Please follow instruction sheet as given.  [facility]   Follow-Up: At [facility], you and your health needs are our priority.  As part of our continuing mission to provide you with exceptional heart care, we have created designated Provider Care Teams.  These Care Teams include your primary Cardiologist (physician) and Advanced Practice Providers (APPs -  Physician Assistants and Nurse Practitioners) who all work together to provide you with the care you need, when you need it. You will need a follow up appointment in 1 years.  Please call our office 2 months in advance to schedule this appointment.  You may see Dr. Harrell Gave or one of the following Advanced Practice Providers on your designated Care Team:   Rosaria Ferries, PA-C . Jory Sims, DNP, ANP  Any Other Special Instructions Will Be Listed Below (If Applicable).  Please arrive at the [facility] main entrance of [facility] at xx:xx AM (30-45 minutes prior to test start time)  [facility] [place], [place] [address] [phone]  Proceed to the [facility] Radiology Department (First Floor).  Please follow these instructions carefully (unless otherwise directed):  Hold all erectile dysfunction medications at least 48 hours prior to test.  On the Night Before the Test: . Be sure to Drink plenty of water. . Do not consume any caffeinated/decaffeinated beverages or chocolate 12 hours prior to your test. . Do not take any antihistamines 12 hours prior to your test.  On the Day of the Test: . Drink plenty of water. Do not drink any water within one hour of the test. . Do not eat any food 4 hours prior to the test. . You may take your regular medications prior to the test.        After the Test: . Drink plenty of water. . After receiving IV contrast, you may experience a mild flushed feeling. This is normal. . On occasion, you may experience a mild rash up to 24 hours after the test. This is not dangerous. If this occurs, you can take Benadryl 25 mg and increase your fluid intake. . If you experience trouble breathing, this can be serious. If it is severe call 911 IMMEDIATELY. If it is mild, please call our office.

## 2017-12-09 ENCOUNTER — Encounter: Payer: BLUE CROSS/BLUE SHIELD | Admitting: Family Medicine

## 2017-12-11 ENCOUNTER — Encounter: Payer: Self-pay | Admitting: Family Medicine

## 2017-12-11 ENCOUNTER — Ambulatory Visit (INDEPENDENT_AMBULATORY_CARE_PROVIDER_SITE_OTHER): Payer: BLUE CROSS/BLUE SHIELD | Admitting: Family Medicine

## 2017-12-11 VITALS — BP 118/68 | HR 60 | Temp 97.8°F | Ht 72.0 in | Wt 181.4 lb

## 2017-12-11 DIAGNOSIS — E782 Mixed hyperlipidemia: Secondary | ICD-10-CM | POA: Diagnosis not present

## 2017-12-11 DIAGNOSIS — Z8042 Family history of malignant neoplasm of prostate: Secondary | ICD-10-CM

## 2017-12-11 DIAGNOSIS — Z0000 Encounter for general adult medical examination without abnormal findings: Secondary | ICD-10-CM | POA: Diagnosis not present

## 2017-12-11 LAB — COMPREHENSIVE METABOLIC PANEL
ALT: 28 U/L (ref 0–53)
AST: 25 U/L (ref 0–37)
Albumin: 4.5 g/dL (ref 3.5–5.2)
Alkaline Phosphatase: 69 U/L (ref 39–117)
BUN: 20 mg/dL (ref 6–23)
CALCIUM: 9.3 mg/dL (ref 8.4–10.5)
CO2: 27 mEq/L (ref 19–32)
Chloride: 105 mEq/L (ref 96–112)
Creatinine, Ser: 0.93 mg/dL (ref 0.40–1.50)
GFR: 92.29 mL/min (ref >60.00)
Glucose, Bld: 95 mg/dL (ref 70–99)
Potassium: 4.2 mEq/L (ref 3.5–5.1)
Sodium: 139 mEq/L (ref 135–145)
Total Bilirubin: 0.5 mg/dL (ref 0.2–1.2)
Total Protein: 6.8 g/dL (ref 6.0–8.3)

## 2017-12-11 LAB — PSA: PSA: 2.12 ng/mL (ref 0.10–4.00)

## 2017-12-11 LAB — LIPID PANEL
Cholesterol: 152 mg/dL (ref 0–200)
HDL: 44.1 mg/dL (ref >39.00)
LDL Cholesterol: 92 mg/dL (ref 0–99)
NonHDL: 107.52
Total CHOL/HDL Ratio: 3
Triglycerides: 80 mg/dL (ref 0.0–149.0)
VLDL: 16 mg/dL (ref 0.0–40.0)

## 2017-12-11 LAB — CBC
HCT: 43.1 % (ref 39.0–52.0)
HEMOGLOBIN: 14.6 g/dL (ref 13.0–17.0)
MCHC: 33.8 g/dL (ref 30.0–36.0)
MCV: 89.9 fl (ref 78.0–100.0)
PLATELETS: 198 10*3/uL (ref 150.0–400.0)
RBC: 4.8 Mil/uL (ref 4.22–5.81)
RDW: 13.6 % (ref 11.5–15.5)
WBC: 4 10*3/uL (ref 4.0–10.5)

## 2017-12-11 NOTE — Patient Instructions (Addendum)
Please stop by lab before you go   Glad you have follow-up with cardiology- I will follow along-hoping for great test results  I still think considering counseling would be great given your current stressors/anxiety  Hope you have a wonderful holiday season

## 2017-12-11 NOTE — Progress Notes (Signed)
Phone: [phone]  Subjective:  Patient presents today for their annual physical. Chief complaint-noted.   See problem oriented charting- ROS- full  review of systems was completed and negative except for: mild cough from allergies- worse this time of year, nosebleeds, tinnitus worse on left, light sensitivity, chest pain as below, urinary frequency from BPH, occasional dizziness with standing up, numbness in hands at times-possible carpal tunnel, anxiety  The following were reviewed and entered/updated in epic: Past Medical History:  Diagnosis Date  . Allergy    allergic rhinitis  . HLD (hyperlipidemia)   . Nosebleed    history of nosebleeds as child  . Wheezing    exercise induced   Patient Active Problem List   Diagnosis Date Noted  . GAD (generalized anxiety disorder) 10/08/2017    Priority: Medium  . History of adenomatous polyp of colon 10/03/2016    Priority: Medium  . Ocular migraine 12/27/2015    Priority: Medium  . HYPERLIPIDEMIA 11/04/2006    Priority: Medium  . Atypical chest pain 11/04/2006    Priority: Medium  . Chronic cough 06/12/2012    Priority: Low  . Lipoma 06/12/2012    Priority: Low  . ALLERGIC RHINITIS 11/04/2006    Priority: Low  . GERD 11/04/2006    Priority: Low  . Otitis media with effusion 02/02/2017   Past Surgical History:  Procedure Laterality Date  . KNEE ARTHROSCOPY Right 1990    wrestling injury  . WISDOM TOOTH EXTRACTION  2014    Family History  Problem Relation Age of Onset  . Multiple sclerosis Mother 61  . Prostate cancer Father        106s or 14s  . Hypertension Father   . Thyroid disease Father   . Multiple sclerosis Maternal Uncle 40  . Leukemia Maternal Uncle   . Thyroid disease Paternal Grandmother   . Colon cancer Neg Hx     Medications- reviewed and updated Current Outpatient Medications  Medication Sig Dispense Refill  . Multiple Vitamin (MULTIVITAMIN) tablet Take 1 tablet by mouth daily.    . Omega-3  Fatty Acids (FISH OIL) 1000 MG CAPS Take 1 capsule by mouth daily.     No current facility-administered medications for this visit.     Allergies-reviewed and updated Allergies  Allergen Reactions  . Omeprazole Rash    Social History   Social History Narrative   Married--2 sons (see his wife kim as well) Sons 10 and 7 in 2017.       Occupation: Radio producer (Forensic psychologist) at [employer]    Objective: BP 118/68 (BP Location: Left Arm, Patient Position: Sitting, Cuff Size: Normal)   Pulse 60   Temp 97.8 F (36.6 C) (Oral)   Ht 6' (1.829 m)   Wt 181 lb 6.4 oz (82.3 kg)   SpO2 98%   BMI 24.60 kg/m  Gen: NAD, resting comfortably HEENT: Mucous membranes are moist. Oropharynx normal Neck: no thyromegaly CV: RRR no murmurs rubs or gallops Lungs: CTAB no crackles, wheeze, rhonchi Abdomen: soft/nontender/nondistended/normal bowel sounds. No rebound or guarding.  Ext: no edema Skin: warm, dry Neuro: grossly normal, moves all extremities, PERRLA Male genitalia: normal Rectal: normal tone, normal prostate, no masses or tenderness   Assessment/Plan:  47 y.o. male presenting for annual physical.  Health Maintenance counseling: 1. Anticipatory guidance: Patient counseled regarding regular dental exams -q6 months, eye exams -yearly,  avoiding smoking and second hand smoke, limiting alcohol to 2 beverages per day - very very rare.   2.  Risk factor reduction:  Advised patient of need for regular exercise and diet rich and fruits and vegetables to reduce risk of heart attack and stroke. Exercise-last year was limited by plantar fasciitis- getting in M-f about 30 minutes and lifting 2-3x a week. Diet-patient has lost a tremendous amount of weight from last year- last September he was 222-has focused on plant based diet, low carb and particularly low sugar. Low on red meat perhaps once a week or less  Wt Readings from Last 3 Encounters:  12/11/17 181 lb 6.4 oz (82.3 kg)  11/22/17  182 lb (82.6 kg)  10/08/17 186 lb 9.6 oz (84.6 kg)  3. Immunizations/screenings/ancillary studies- has already had flu shot Immunization History  Administered Date(s) Administered  . Influenza Whole 12/12/2009  . Influenza,inj,Quad PF,6+ Mos 11/05/2012, 10/21/2013, 11/19/2014, 12/27/2015, 12/27/2016, 10/08/2017  . MMR 05/28/2009  . Td 05/24/2009  4. Prostate cancer screening-we are screening him early due to father with prostate cancer in 37s or 12s.  Will get PSA today.  BPH on exam in the past- if he waits for a while gets more start/stop and less intense stream.  He also follows with urology each march- asks for rectal here and there.  Lab Results  Component Value Date   PSA 1.95 03/18/2017   PSA 2.66 12/27/2016   PSA 2.09 09/11/2016   5. Colon cancer screening - early colonoscopy due to change in bowel habits-has 5-year follow-up plan due to adenomatous colon polyp history- due again in 2023 6. Skin cancer screening- no dermatologist. advised regular sunscreen use. Denies worrisome, changing, or new skin lesions.  7.  Never smoker 8.  Opts out of STD screening is monogamous 9.  Monthly testicular self exams  Advised- he doesn't do this- so we will examine today-no concern on exam  Status of chronic or acute concerns   Patient was referred to cardiology 2 months ago due to atypical chest pain-thought most likely due to GERD.  We also mentioned possible anxiety as cause-advised CBT with consideration of SSRI if not improving.  He had an exercise stress test which was reassuring in January 2019.  More recently, coronary CT scan was ordered by Dr. Harrell Gave- does not look like this is been completed yet.  Also does not look like patient completed blood work Dr. Harrell Gave ordered- plan is to do this before the coronary CT (pending insurance). He has had some more chest pain in last 2 weeks- states felt very gassy at the time- and has had intermittently.   Hyperlipidemia risk still under  5%- continue to monitor  Still with occasional ocular migraines- follows with optho  GAD- has not done CBT yet- still dealing with some anxiety. Very busy at work, a lot of travel, wife lost job, caring for 2 kids  Occasional nosebleeds recently. Discussed nasal saline  Tinnitus in left ear  Return in about 1 year (around 12/12/2018) for physical.  Lab/Order associations: Fasting Preventative health care - Plan: CBC, Lipid panel, Comprehensive metabolic panel, PSA  HYPERLIPIDEMIA - Plan: CBC, Lipid panel, Comprehensive metabolic panel  Family history of prostate cancer - Plan: PSA  Return precautions advised.  Garret Reddish, MD

## 2017-12-26 DIAGNOSIS — H0100B Unspecified blepharitis left eye, upper and lower eyelids: Secondary | ICD-10-CM | POA: Diagnosis not present

## 2017-12-26 DIAGNOSIS — H5310 Unspecified subjective visual disturbances: Secondary | ICD-10-CM | POA: Diagnosis not present

## 2017-12-26 DIAGNOSIS — H5213 Myopia, bilateral: Secondary | ICD-10-CM | POA: Diagnosis not present

## 2017-12-26 DIAGNOSIS — H0100A Unspecified blepharitis right eye, upper and lower eyelids: Secondary | ICD-10-CM | POA: Diagnosis not present

## 2017-12-27 ENCOUNTER — Encounter: Payer: BLUE CROSS/BLUE SHIELD | Admitting: Family Medicine

## 2018-01-09 ENCOUNTER — Telehealth (HOSPITAL_COMMUNITY): Payer: Self-pay | Admitting: Emergency Medicine

## 2018-01-09 NOTE — Telephone Encounter (Signed)
Pt returning phone call; Discussed  understanding of appt date/time, parking situation and where to check in, pre-test NPO status and medications ordered, and verified current allergies; name and call back number provided for further questions should they arise Marchia Bond RN Navigator Cardiac Imaging [phone]

## 2018-01-09 NOTE — Telephone Encounter (Signed)
Reaching out to patient to offer assistance regarding upcoming cardiac imaging study; unable to reach patient but name and call back number provided in voicemail for further questions should they arise Ruthy Forry RN Navigator Cardiac Imaging [phone] 

## 2018-01-10 DIAGNOSIS — E782 Mixed hyperlipidemia: Secondary | ICD-10-CM | POA: Diagnosis not present

## 2018-01-10 DIAGNOSIS — Z79899 Other long term (current) drug therapy: Secondary | ICD-10-CM | POA: Diagnosis not present

## 2018-01-12 LAB — BASIC METABOLIC PANEL
BUN/Creatinine Ratio: 19 (ref 9–20)
BUN: 19 mg/dL (ref 6–24)
CO2: 22 mmol/L (ref 20–29)
CREATININE: 1.01 mg/dL (ref 0.76–1.27)
Calcium: 9.2 mg/dL (ref 8.7–10.2)
Chloride: 102 mmol/L (ref 96–106)
GFR calc Af Amer: 102 mL/min/{1.73_m2} (ref >59)
GFR calc non Af Amer: 88 mL/min/{1.73_m2} (ref >59)
Glucose: 82 mg/dL (ref 65–99)
Potassium: 4.3 mmol/L (ref 3.5–5.2)
Sodium: 141 mmol/L (ref 134–144)

## 2018-01-12 LAB — LIPID PANEL
Chol/HDL Ratio: 3.4 ratio (ref 0.0–5.0)
Cholesterol, Total: 187 mg/dL (ref 100–199)
HDL: 55 mg/dL (ref >39)
LDL Calculated: 121 mg/dL — ABNORMAL HIGH (ref 0–99)
Triglycerides: 56 mg/dL (ref 0–149)
VLDL Cholesterol Cal: 11 mg/dL (ref 5–40)

## 2018-01-12 LAB — HIGH SENSITIVITY CRP: CRP, High Sensitivity: 0.22 mg/L (ref 0.00–3.00)

## 2018-01-12 LAB — LIPOPROTEIN A (LPA): LIPOPROTEIN (A): 9.2 nmol/L (ref <75.0)

## 2018-01-13 ENCOUNTER — Ambulatory Visit (HOSPITAL_COMMUNITY): Admission: RE | Admit: 2018-01-13 | Payer: BLUE CROSS/BLUE SHIELD | Source: Ambulatory Visit

## 2018-01-13 ENCOUNTER — Ambulatory Visit (HOSPITAL_COMMUNITY)
Admission: RE | Admit: 2018-01-13 | Discharge: 2018-01-13 | Disposition: A | Discharge status: Home / Self Care | Payer: BLUE CROSS/BLUE SHIELD | Source: Ambulatory Visit | Attending: Cardiology | Admitting: Cardiology

## 2018-01-13 ENCOUNTER — Telehealth: Payer: Self-pay | Admitting: Cardiology

## 2018-01-13 ENCOUNTER — Encounter (HOSPITAL_COMMUNITY): Payer: Self-pay

## 2018-01-13 DIAGNOSIS — R079 Chest pain, unspecified: Secondary | ICD-10-CM | POA: Diagnosis not present

## 2018-01-13 DIAGNOSIS — Z006 Encounter for examination for normal comparison and control in clinical research program: Secondary | ICD-10-CM

## 2018-01-13 MED ORDER — IOPAMIDOL (ISOVUE-370) INJECTION 76%
80.0000 mL | Freq: Once | INTRAVENOUS | Status: AC | PRN
  Administered 2018-01-13: 80 mL via INTRAVENOUS

## 2018-01-13 MED ORDER — NITROGLYCERIN 0.4 MG SL SUBL
SUBLINGUAL_TABLET | SUBLINGUAL | Status: AC
  Filled 2018-01-13: qty 2

## 2018-01-13 MED ORDER — NITROGLYCERIN 0.4 MG SL SUBL
0.8000 mg | SUBLINGUAL_TABLET | SUBLINGUAL | Status: DC | PRN
  Administered 2018-01-13: 0.8 mg via SUBLINGUAL
  Filled 2018-01-13: qty 25

## 2018-01-13 NOTE — Telephone Encounter (Signed)
Pt updated with lab results along with Dr. Judeth Cornfield recommendations. Pt states he is concerned as to why his LDL jump back up. He reports that his diet hasn't changed and he has worked really hard to bring it down. Will route to MD.

## 2018-01-13 NOTE — Research (Signed)
Stephen Bridges met inclusion and exclusion criteria.  The informed consent form, study requirements and expectations were reviewed with the subject and questions and concerns were addressed prior to the signing of the consent form.  The subject verbalized understanding of the trial requirements.  The subject agreed to participate in the CADFEM trial and signed the informed consent.  The informed consent was obtained prior to performance of any protocol-specific procedures for the subject.  A copy of the signed informed consent was given to the subject and a copy was placed in the subject's medical record.   [name]. [employer]

## 2018-01-13 NOTE — Telephone Encounter (Signed)
Follow up:   Patient returning call concerning some results. Please call patient.

## 2018-01-14 NOTE — Telephone Encounter (Signed)
Follow up:    Patient returning call back concerning some results. Please call patient back.

## 2018-01-14 NOTE — Telephone Encounter (Signed)
Pt updated with Cardiac CTA test results along with Dr. Judeth Cornfield recommendation. Pt verbalized understanding.

## 2018-01-14 NOTE — Telephone Encounter (Signed)
Comment attached to result note

## 2018-07-16 DIAGNOSIS — R972 Elevated prostate specific antigen [PSA]: Secondary | ICD-10-CM | POA: Diagnosis not present

## 2018-07-16 DIAGNOSIS — N5201 Erectile dysfunction due to arterial insufficiency: Secondary | ICD-10-CM | POA: Diagnosis not present

## 2018-12-16 NOTE — Progress Notes (Signed)
Phone: [phone]    Subjective:  Patient presents today for their annual physical. Chief complaint-noted.   See problem oriented charting- Review of Systems  Constitutional: Negative.   HENT: Positive for tinnitus.        Ringing in both ears have increased   Eyes: Negative.   Respiratory: Negative.   Cardiovascular: Negative.   Gastrointestinal: Positive for heartburn.  Genitourinary: Negative.        Followed by urology. Last appointment in July   Musculoskeletal: Positive for back pain and joint pain.       Left ankle injury with running  Mild back pian between shoulders.   Skin: Negative.   Neurological: Positive for dizziness.       When getting up to fast. Has had issus for several years. No change in symptoms.   Endo/Heme/Allergies: Negative.   Psychiatric/Behavioral: Negative.    The following were reviewed and entered/updated in epic: Past Medical History:  Diagnosis Date  . Allergy    allergic rhinitis  . HLD (hyperlipidemia)   . Nosebleed    history of nosebleeds as child  . Wheezing    exercise induced   Patient Active Problem List   Diagnosis Date Noted  . GAD (generalized anxiety disorder) 10/08/2017  . Otitis media with effusion 02/02/2017  . History of adenomatous polyp of colon 10/03/2016  . Ocular migraine 12/27/2015  . Chronic cough 06/12/2012  . Lipoma 06/12/2012  . HYPERLIPIDEMIA 11/04/2006  . ALLERGIC RHINITIS 11/04/2006  . GERD 11/04/2006  . Atypical chest pain 11/04/2006   Past Surgical History:  Procedure Laterality Date  . KNEE ARTHROSCOPY Right 1990    wrestling injury  . WISDOM TOOTH EXTRACTION  2014    Family History  Problem Relation Age of Onset  . Multiple sclerosis Mother 60  . Prostate cancer Father        65s or 61s  . Hypertension Father   . Thyroid disease Father   . Multiple sclerosis Maternal Uncle 40  . Leukemia Maternal Uncle   . Thyroid disease Paternal Grandmother   . Colon cancer Neg Hx      Medications- reviewed and updated Current Outpatient Medications  Medication Sig Dispense Refill  . Multiple Vitamin (MULTIVITAMIN) tablet Take 1 tablet by mouth daily.    . Omega-3 Fatty Acids (FISH OIL) 1000 MG CAPS Take 1 capsule by mouth daily.    . Turmeric (QC TUMERIC COMPLEX) 500 MG CAPS Take by mouth.     No current facility-administered medications for this visit.     Allergies-reviewed and updated Allergies  Allergen Reactions  . Omeprazole Rash    Social History   Social History Narrative   Married--2 sons (see his wife kim as well) Sons 10 and 7 in 2017.       Occupation: Radio producer (Forensic psychologist) at [employer]      Objective:  BP 126/72   Pulse 82   Temp 98.2 F (36.8 C) (Temporal)   Ht 6' (1.829 m)   Wt 190 lb 9.6 oz (86.5 kg)   SpO2 95%   BMI 25.85 kg/m  Gen: NAD, resting comfortably HEENT: Mask not removed due to covid 19. TM normal. Bridge of nose normal. Eyelids normal.  Neck: no thyromegaly or cervical lymphadenopathy  CV: RRR no murmurs rubs or gallops Lungs: CTAB no crackles, wheeze, rhonchi Abdomen: soft/nontender/nondistended/normal bowel sounds. No rebound or guarding.  Ext: no edema Skin: warm, dry Neuro: grossly normal, moves all extremities, PERRLA Rectal: normal tone, diffusely enlarged  prostate, no masses or tenderness     Assessment and Plan:  48 y.o. male presenting for annual physical.  Health Maintenance counseling: 1. Anticipatory guidance: Patient counseled regarding regular dental exams q6 months, eye exams-  annually ,  avoiding smoking and second hand smoke , limiting alcohol to 2 beverages per day. Only has 2-3 drinks every few months.    2. Risk factor reduction:  Advised patient of need for regular exercise and diet rich and fruits and vegetables to reduce risk of heart attack and stroke. Exercise- has been running 6-6 days a week but had to cut back to 3-4 days a week due to ankle pain- misses the gym.  Diet-he  has been having issues with healthy diet due to being home. Has noticed he is eating more carbohydrates and snacking now that he is working from home .  Weight up 9 lbs from last year - feels if he would cut back on snacking that can help and also cut down on carbs. Consider weighing on weekly basis.  Wt Readings from Last 3 Encounters:  12/17/18 190 lb 9.6 oz (86.5 kg)  12/11/17 181 lb 6.4 oz (82.3 kg)  11/22/17 182 lb (82.6 kg)  3. Immunizations/screenings/ancillary studies-flu shot. Patient has received from work chart updated.   Immunization History  Administered Date(s) Administered  . Influenza Inj Mdck Quad Pf 10/29/2018  . Influenza Whole 12/12/2009  . Influenza,inj,Quad PF,6+ Mos 11/05/2012, 10/21/2013, 11/19/2014, 12/27/2015, 12/27/2016, 10/08/2017  . MMR 05/28/2009  . Td 05/24/2009  4. Prostate cancer screening- He is followed by urology yearly. Father with prostate cancer in his 66s or 6s.  We have opted to do PSAs annually.  He also follows with urology each March.  He wants rectal exams in both locations due to concern about prostate cancer  Lab Results  Component Value Date   PSA 2.12 12/11/2017   PSA 1.95 03/18/2017   PSA 2.66 12/27/2016   5. Colon cancer screening - next due 2023 History of adenomatous polyp of colon in September 2018 with 5-year follow-up.Patient does not have any concerns at this time.  6. Skin cancer screening/prevention-no dermatologist.advised regular sunscreen use. Denies worrisome, changing, or new skin lesions- wants me to look at lipoma as below.  7. Testicular cancer screening- advised monthly self exams. He has not been doing recently. Updated exam today not concerning  8. STD screening- patient opts out as monogamous 9.  Never smoker-   Status of chronic or acute concerns   Heat out since mid November- trying to work through this through home warranty.   HYPERLIPIDEMIA -patient with mild lipid elevations.  10-year ASCVD risk based on last  year's labs is 2%.  We will calculate again today and discussed as long as risk under 7.5% would not recommend medication   Has an annual cardiology visit that he may push out coming up later this week- no chest pain or issues lately.   GAD (generalized anxiety disorder)-recommended cognitive behavioral therapy last October.  Patient reports did not do counseling at that time but fortunately he has not had any issues with anxiety with reduced travel schedule.     Ocular migraine-continues to follow-up with [facility] regularly. He has been seeing Dr. Valetta Close locally. His next appointment with him is on 12/29/2018. He has not had any change in symptoms or frequency.    Chronic cough- nagging chronic cough for over 10 years. CXR 2014 with Dr. Shawna Orleans. Apparently had PFTs as well- asthma testing negative.  Thought likely post nasal drip.did try omeprazole in past but had allergy. Had tried zantac and not sure if helped with cough. He does not particularly want to try medicines at this point- could try pepcid for reflux potential portion or saline or flonase or claritin if desired  Some tinnitus in both ears- worse on left. He would be interested in ENT but wants things to calm down with covid right now.   MSK concerns 1. 6-7 weeks of pain. Increased ankle pain on the left with increasing running.  He has pulled back on amount of running to see if this would help- took 2-3 weeks off but started back and bothers him again- lateral to insertion of achilles tendon. Wonders if he could have rolled ankle in small hole on road at some point  - refer to Sports medicine 2. Some  Mid back tension after sleeping- upper portion of back hurts when neck - see avs for advice 3. Lipoma on right side- stable without pain- he would like to consider surgery in the future potentially  Recommended follow up: Return in about 1 year (around 12/17/2019) for CPE. Future Appointments  Date Time Provider [place]   12/19/2018  4:00 PM Buford Dresser, MD [facility]   Lab/Order associations: Fasting last meal last night at 8:00pm.    ICD-10-CM   1. Preventative health care  Z00.00 CBC with Differential/Platelet    Comprehensive metabolic panel    Lipid panel    PSA  2. HYPERLIPIDEMIA  E78.2 CBC with Differential/Platelet    Comprehensive metabolic panel    Lipid panel  3. History of adenomatous polyp of colon  Z86.010   4. GAD (generalized anxiety disorder)  F41.1   5. Ocular migraine  G43.109   6. Screening for prostate cancer  Z12.5 PSA    No orders of the defined types were placed in this encounter.   Return precautions advised.   Francella Solian, CMA

## 2018-12-17 ENCOUNTER — Ambulatory Visit (INDEPENDENT_AMBULATORY_CARE_PROVIDER_SITE_OTHER): Payer: BC Managed Care – PPO | Admitting: Family Medicine

## 2018-12-17 ENCOUNTER — Encounter: Payer: Self-pay | Admitting: Family Medicine

## 2018-12-17 VITALS — BP 126/72 | HR 82 | Temp 98.2°F | Ht 72.0 in | Wt 190.6 lb

## 2018-12-17 DIAGNOSIS — G43109 Migraine with aura, not intractable, without status migrainosus: Secondary | ICD-10-CM

## 2018-12-17 DIAGNOSIS — Z86010 Personal history of colonic polyps: Secondary | ICD-10-CM | POA: Diagnosis not present

## 2018-12-17 DIAGNOSIS — F411 Generalized anxiety disorder: Secondary | ICD-10-CM | POA: Diagnosis not present

## 2018-12-17 DIAGNOSIS — Z0000 Encounter for general adult medical examination without abnormal findings: Secondary | ICD-10-CM

## 2018-12-17 DIAGNOSIS — Z125 Encounter for screening for malignant neoplasm of prostate: Secondary | ICD-10-CM | POA: Diagnosis not present

## 2018-12-17 DIAGNOSIS — G8929 Other chronic pain: Secondary | ICD-10-CM

## 2018-12-17 DIAGNOSIS — E782 Mixed hyperlipidemia: Secondary | ICD-10-CM

## 2018-12-17 DIAGNOSIS — M25572 Pain in left ankle and joints of left foot: Secondary | ICD-10-CM

## 2018-12-17 LAB — COMPREHENSIVE METABOLIC PANEL
ALT: 25 U/L (ref 0–53)
AST: 31 U/L (ref 0–37)
Albumin: 4.6 g/dL (ref 3.5–5.2)
Alkaline Phosphatase: 70 U/L (ref 39–117)
BUN: 18 mg/dL (ref 6–23)
CO2: 29 mEq/L (ref 19–32)
Calcium: 9.3 mg/dL (ref 8.4–10.5)
Chloride: 101 mEq/L (ref 96–112)
Creatinine, Ser: 1.03 mg/dL (ref 0.40–1.50)
GFR: 76.85 mL/min (ref >60.00)
Glucose, Bld: 95 mg/dL (ref 70–99)
Potassium: 4 mEq/L (ref 3.5–5.1)
Sodium: 137 mEq/L (ref 135–145)
Total Bilirubin: 0.9 mg/dL (ref 0.2–1.2)
Total Protein: 7.2 g/dL (ref 6.0–8.3)

## 2018-12-17 LAB — CBC WITH DIFFERENTIAL/PLATELET
Basophils Absolute: 0 10*3/uL (ref 0.0–0.1)
Basophils Relative: 0.4 % (ref 0.0–3.0)
Eosinophils Absolute: 0.1 10*3/uL (ref 0.0–0.7)
Eosinophils Relative: 2.3 % (ref 0.0–5.0)
HCT: 43.6 % (ref 39.0–52.0)
Hemoglobin: 14.8 g/dL (ref 13.0–17.0)
Lymphocytes Relative: 28.2 % (ref 12.0–46.0)
Lymphs Abs: 1.2 10*3/uL (ref 0.7–4.0)
MCHC: 34 g/dL (ref 30.0–36.0)
MCV: 89.3 fl (ref 78.0–100.0)
Monocytes Absolute: 0.3 10*3/uL (ref 0.1–1.0)
Monocytes Relative: 7.5 % (ref 3.0–12.0)
Neutro Abs: 2.6 10*3/uL (ref 1.4–7.7)
Neutrophils Relative %: 61.6 % (ref 43.0–77.0)
Platelets: 218 10*3/uL (ref 150.0–400.0)
RBC: 4.88 Mil/uL (ref 4.22–5.81)
RDW: 13.8 % (ref 11.5–15.5)
WBC: 4.3 10*3/uL (ref 4.0–10.5)

## 2018-12-17 LAB — LIPID PANEL
Cholesterol: 179 mg/dL (ref 0–200)
HDL: 52.2 mg/dL (ref >39.00)
LDL Cholesterol: 115 mg/dL — ABNORMAL HIGH (ref 0–99)
NonHDL: 127.17
Total CHOL/HDL Ratio: 3
Triglycerides: 60 mg/dL (ref 0.0–149.0)
VLDL: 12 mg/dL (ref 0.0–40.0)

## 2018-12-17 LAB — PSA: PSA: 3.53 ng/mL (ref 0.10–4.00)

## 2018-12-17 NOTE — Patient Instructions (Addendum)
Please stop by lab before you go If you do not have mychart- we will call you about results within 5 business days of [place] receiving them.  If you have mychart- we will send your results within 3 business days of [place] receiving them.  If abnormal or we want to clarify a result, we will call or mychart you to make sure you receive the message.  If you have questions or concerns or don't hear within 5-7 days, please send [place] a message or call us.   For cough- could try pepcid for reflux potential portion or saline or flonase or claritin if desired  Please stop by the desk and schedule a visit with Dr. Georgina Snell of sports medicine  for the left ankle.   If it takes a few days to get in- would be reasonable to do aleve twice a day along with pepcid twice a day with meals. That may help the back as well as the ankle. Also could try heating pad 15 minutes 4x a day for the back.   Recommended follow up: Return in about 1 year (around 12/17/2019) for CPE.

## 2018-12-18 ENCOUNTER — Other Ambulatory Visit: Payer: Self-pay

## 2018-12-18 ENCOUNTER — Ambulatory Visit: Payer: Self-pay

## 2018-12-18 ENCOUNTER — Encounter: Payer: Self-pay | Admitting: Family Medicine

## 2018-12-18 ENCOUNTER — Ambulatory Visit (INDEPENDENT_AMBULATORY_CARE_PROVIDER_SITE_OTHER): Payer: BC Managed Care – PPO | Admitting: Family Medicine

## 2018-12-18 VITALS — BP 120/78 | HR 69 | Ht 72.0 in | Wt 194.6 lb

## 2018-12-18 DIAGNOSIS — M25572 Pain in left ankle and joints of left foot: Secondary | ICD-10-CM | POA: Diagnosis not present

## 2018-12-18 NOTE — Patient Instructions (Addendum)
Thank you for coming in today. Use voltaren gel on the ankle 4x daily for pain as needed.  Use the body helix full ankle sleeve.  Do the exercises Molly reviewed.   Please perform the exercise program that we have prepared for you and gone over in detail on a daily basis.  In addition to the handout you were provided you can access your program through: [email or web address]   Your unique program code is:  [email or web address]   Get xray now and anticipate MRI after xray.  Recheck with me following MRI.  Bring your running shoes and shorts with you.   I am  concerned a bit for calcaneous stress fracture.   We're moving!  Dr. Clovis Riley new office will be located at [address] on the 1st floor.  This location is across the street from the [employer] and in the same complex as the [facility] and [employer]  Our new office phone number will be [phone].  We anticipate beginning to see patients at the [facility] office in early December 2020.

## 2018-12-18 NOTE — Progress Notes (Signed)
Subjective:    I'm seeing this patient as a consultation for: Dr. Garret Reddish.  Note be routed back to referring provider.  CC: L ankle pain  I, Molly Weber, LAT, ATC, am serving as scribe for Dr. Lynne Leader.  HPI: Pt is a 48 y/o male presenting w/ c/o L ankle pain x 6-7 weeks that began w/ increased running mileage.  His pain is located just lateral to the insertion of the Achille's tendon.  He took 2-3 weeks off of running and then started back up and pain returned.  He rates his pain as mild at rest (2/10) and as moderate (5-6) at it's worst after running.  He describes it as sharp and aching.  Pt notes a history of B plantar fasciitis.  He states that he tried his "plantar fascia socks" on the L which made his symptoms worse.  He does note some radiating pain into the L Achille's.  Aggravating factors include running.  He denies any numbness/tingling or swelling in the area.  He runs approximately 5 miles 5 to 6 days a week.  He is not tried any specific other treatment for his pain.  Past medical history, Surgical history, Family history not pertinant except as noted below, Social history, Allergies, and medications have been entered into the medical record, reviewed, and no changes needed.   Review of Systems: No headache, visual changes, nausea, vomiting, diarrhea, constipation, dizziness, abdominal pain, skin rash, fevers, chills, night sweats, weight loss, swollen lymph nodes, body aches, joint swelling, muscle aches, chest pain, shortness of breath, mood changes, visual or auditory hallucinations.   Objective:    Vitals:   12/18/18 0759  BP: 120/78  Pulse: 69  SpO2: 99%   General: Well Developed, well nourished, and in no acute distress.  Neuro/Psych: Alert and oriented x3, extra-ocular muscles intact, able to move all 4 extremities, sensation grossly intact. Skin: Warm and dry, no rashes noted.  Respiratory: Not using accessory muscles, speaking in full sentences, trachea  midline.  Cardiovascular: Pulses palpable, no extremity edema. Abdomen: Does not appear distended. MSK:  Left foot and ankle. Slight high arch foot neutral foot positioning. Foot otherwise normal-appearing Normal foot and ankle motion. Tender palpation at posterior lateral calcaneus just anterior to the Achilles tendon insertion. Nontender at the fibula and area just posterior to the fibula and lateral malleolus. Nontender to the foot. Normal strength.  Right foot and ankle: Again slightly elevated arch but neutral foot positioning. Foot otherwise normal-appearing Normal foot and ankle motion.  Nontender. Normal strength.  Pulses cap refill and sensation are intact distal bilateral lower extremities.  Lab and Radiology Results X-ray imges left calcaneus ordered today however will be done at outside location as x-ray is not available at my facility today.  Limited musculoskeletal ultrasound left lateral foot and ankle. Normal-appearing peroneal tendons at lateral malleolus.  Normal peroneal tendons tract to peroneal brevis tendon insertion onto proximal fifth metatarsal. Normal Achilles tendon insertion of the calcaneus. Calcaneus visualized at area of tenderness at the lateral posterior aspect of calcaneus. Slight gap in bone cortex associated with increased vascular activity on Doppler visualized at area of tenderness.  No callus formation visible. Calcaneus otherwise normal-appearing Soft tissue hypoechoic change visible near this area as well. Impression: Concerning for calcaneus stress fracture or stress reaction. Soft tissue inflammation and swelling at posterior lateral ankle present as well.  Impression and Recommendations:    Assessment and Plan: 48 y.o. male with left posterior lateral ankle pain present  for about 7 weeks.  Pain occurs during and following exercise.  The tendinous structures at the posterior lateral ankle are normal-appearing to physical exam and  ultrasound exam.  He has soft tissue swelling overlying the posterior lateral calcaneus visible on ultrasound with slight gap in the bone cortex.  This is somewhat concerning for calcaneus stress fracture versus stress reaction. Plan for further work-up.  X-ray will be done in the near future at outside location.  Additionally will likely proceed with MRI following x-ray report. We will treat conservatively additionally with compressive ankle sleeve and topical diclofenac gel as well as some peroneal tendon and Achilles tendon eccentric exercises.  Recheck following planned MRI.   Precautions reviewed with patient including detailed discussion of differential diagnosis and treatment plan and next steps.   Orders Placed This Encounter  Procedures  . NO CHG - [place] LOWER LEFT    Order Specific Question:   Reason for Exam (SYMPTOM  OR DIAGNOSIS REQUIRED)    Answer:   L ankle pain    Order Specific Question:   Preferred imaging location?    Answer:   [place]  . XR Calcaneous Left    Standing Status:   Future    Standing Expiration Date:   02/18/2020    Order Specific Question:   Reason for exam:    Answer:   eval post lat calcaneous ankle pain. Concern stress fx    Order Specific Question:   Preferred imaging location?    Answer:   [facility]   No orders of the defined types were placed in this encounter.   Discussed warning signs or symptoms. Please see discharge instructions. Patient expresses understanding.  The above documentation has been reviewed and is accurate and complete Lynne Leader

## 2018-12-19 ENCOUNTER — Ambulatory Visit: Payer: BLUE CROSS/BLUE SHIELD | Admitting: Cardiology

## 2018-12-29 DIAGNOSIS — H5310 Unspecified subjective visual disturbances: Secondary | ICD-10-CM | POA: Diagnosis not present

## 2018-12-29 DIAGNOSIS — H524 Presbyopia: Secondary | ICD-10-CM | POA: Diagnosis not present

## 2019-01-20 ENCOUNTER — Telehealth: Payer: Self-pay

## 2019-01-20 NOTE — Telephone Encounter (Signed)
Called pt to change appointment to virtual. Pt voiced that he would prefer to be seen in office. Appointment rescheduled to 1/15 at 10 am.

## 2019-01-22 ENCOUNTER — Ambulatory Visit: Payer: BC Managed Care – PPO | Admitting: Cardiology

## 2019-01-23 ENCOUNTER — Encounter: Payer: Self-pay | Admitting: Cardiology

## 2019-01-23 ENCOUNTER — Ambulatory Visit (INDEPENDENT_AMBULATORY_CARE_PROVIDER_SITE_OTHER): Payer: 59 | Admitting: Cardiology

## 2019-01-23 ENCOUNTER — Other Ambulatory Visit: Payer: Self-pay

## 2019-01-23 VITALS — BP 126/78 | HR 58 | Temp 97.7°F | Ht 72.0 in | Wt 192.0 lb

## 2019-01-23 DIAGNOSIS — R0789 Other chest pain: Secondary | ICD-10-CM

## 2019-01-23 DIAGNOSIS — R001 Bradycardia, unspecified: Secondary | ICD-10-CM

## 2019-01-23 DIAGNOSIS — E782 Mixed hyperlipidemia: Secondary | ICD-10-CM | POA: Diagnosis not present

## 2019-01-23 DIAGNOSIS — Z7189 Other specified counseling: Secondary | ICD-10-CM

## 2019-01-23 DIAGNOSIS — Z712 Person consulting for explanation of examination or test findings: Secondary | ICD-10-CM | POA: Diagnosis not present

## 2019-01-23 DIAGNOSIS — R079 Chest pain, unspecified: Secondary | ICD-10-CM

## 2019-01-23 NOTE — Patient Instructions (Signed)

## 2019-01-23 NOTE — Progress Notes (Signed)
Cardiology Office Note:    Date:  01/23/2019   ID:  ZAREK DACKO, DOB [birth year], MRN [id number]  PCP:  Marin Olp, MD  Cardiologist:  Buford Dresser, MD PhD  Referring MD: Marin Olp, MD   CC: follow up  History of Present Illness:    Stephen Bridges is a 49 y.o. male with a hx of allergies, hyperlipidemia, anxiety who is seen for follow up. I initially saw him 11/22/2017 as a new consult at the request of Marin Olp, MD for the evaluation and management of chest pain.  Cardiac history: several years of sharp/tense left sided chest pain with occasional radiation to neck and arms. Worse with stress.  -cardiac CTA 01/14/18 normal, no calcium -high cholesterol, never been on medications. Highest is LDL 177, lowest 143. Totals over 200, highest 240.  -POET normal 02/06/2017,  -Family history: high cholesterol in father, brother, maternal aunts. [name] with aneurysm, [name] with stroke. Mat Gpa with emphysema, Mat Gma with liver failure. Mom has multiple sclerosis or similar. No early onset, no sudden cardiac death.  Today: Has been stuck inside with the pandemic, still trying to exercise as much as he can. Has gained a little weight with the pandemic. Diet has not been as good, eats a lot of vegetarian but does have a lot of sodium. BP has been largely normal though slightly elevated. We discussed diet recommendations and sodium today.  Chest pain/arm numbness persists, worse with increases in stress. Reviewed CT results again with him. Feels better if he runs a few miles. We reviewed the calcium score and looked at the chart for 15 year mortality, discussed how a calcium score of 0 is helpful for this risk stratification.   Denies shortness of breath at rest or with normal exertion. No PND, orthopnea, LE edema. No syncope or palpitations.  Past Medical History:  Diagnosis Date  . Allergy    allergic rhinitis  . HLD (hyperlipidemia)   . Nosebleed    history of  nosebleeds as child  . Wheezing    exercise induced    Past Surgical History:  Procedure Laterality Date  . KNEE ARTHROSCOPY Right 1990    wrestling injury  . WISDOM TOOTH EXTRACTION  2014    Current Medications: Current Outpatient Medications on File Prior to Visit  Medication Sig  . CO-ENZYME Q10 PO Take by mouth.  . Garlic 123XX123 MG TABS Take by mouth.  . Multiple Vitamin (MULTIVITAMIN) tablet Take 1 tablet by mouth daily.  . Omega-3 Fatty Acids (FISH OIL) 1000 MG CAPS Take 1 capsule by mouth daily.  . saw palmetto 80 MG capsule Take 80 mg by mouth 2 (two) times daily.  . Turmeric (QC TUMERIC COMPLEX) 500 MG CAPS Take by mouth.   No current facility-administered medications on file prior to visit.     Allergies:   Omeprazole   Social History   Tobacco Use  . Smoking status: Never Smoker  . Smokeless tobacco: Never Used  Substance Use Topics  . Alcohol use: Yes    Comment: occasional  . Drug use: No    Family History: The patient's family history includes Hypertension in his father; Leukemia in his maternal uncle; Multiple sclerosis (age of onset: 71) in his mother; Multiple sclerosis (age of onset: 54) in his maternal uncle; Prostate cancer in his father; Thyroid disease in his father and paternal grandmother. There is no history of Colon cancer. high cholesterol in father, brother, maternal aunts.  [name] with aneurysm, [name] with stroke. Mat Gpa with emphysema, Mat Gma with liver failure. Mom has multiple sclerosis or similar. No early onset, no sudden cardiac death.  ROS:   Please see the history of present illness.  Additional ROS negative except as documented in HPI.    EKGs/Labs/Other Studies Reviewed:    The following studies were reviewed today: CT cardiac 01/13/18 1. Coronary artery calcium score 0 Agatston units, suggesting low risk for future cardiac events. 2.  No significant coronary disease noted.  POET 02/06/17  Blood pressure demonstrated a  normal response to exercise.  No T wave inversion was noted during stress.  Upsloping ST segment depression ST segment depression was noted during stress in the V4, V6, V5, II, III and aVF leads, and returning to baseline after less than 1 minute of recovery.  Negative, adequate stress test. Exercise Time  Exercise duration (min) 11 min    Exercise duration (sec) 0 sec    Peak Stress Vitals  Peak HR 179 bpm    Peak BP 175/81 mmHg    Exercise Data  MPHR 174 bpm    Percent HR 102 %    RPE 16     Estimated workload 13.4 METS      EKG:  EKG is personally reviewed.  The ekg ordered today demonstrates sinus bradycardia  Recent Labs: 12/17/2018: ALT 25; BUN 18; Creatinine, Ser 1.03; Hemoglobin 14.8; Platelets 218.0; Potassium 4.0; Sodium 137  Recent Lipid Panel    Component Value Date/Time   CHOL 179 12/17/2018 0857   CHOL 187 01/10/2018 1156   TRIG 60.0 12/17/2018 0857   HDL 52.20 12/17/2018 0857   HDL 55 01/10/2018 1156   CHOLHDL 3 12/17/2018 0857   VLDL 12.0 12/17/2018 0857   LDLCALC 115 (H) 12/17/2018 0857   LDLCALC 121 (H) 01/10/2018 1156   LDLDIRECT 148.9 10/24/2012 0943    Physical Exam:    VS:  BP 126/78   Pulse (!) 58   Temp 97.7 F (36.5 C)   Ht 6' (1.829 m)   Wt 192 lb (87.1 kg)   SpO2 99%   BMI 26.04 kg/m     Wt Readings from Last 3 Encounters:  01/23/19 192 lb (87.1 kg)  12/18/18 194 lb 9.6 oz (88.3 kg)  12/17/18 190 lb 9.6 oz (86.5 kg)    GEN: Well nourished, well developed in no acute distress HEENT: Normal, moist mucous membranes NECK: No JVD CARDIAC: regular rhythm, normal S1 and S2, no rubs or gallops. No murmur. VASCULAR: Radial and DP pulses 2+ bilaterally. No carotid bruits RESPIRATORY:  Clear to auscultation without rales, wheezing or rhonchi  ABDOMEN: Soft, non-tender, non-distended MUSCULOSKELETAL:  Ambulates independently SKIN: Warm and dry, no edema NEUROLOGIC:  Alert and oriented x 3. No focal neuro deficits  noted. PSYCHIATRIC:  Normal affect   ASSESSMENT:    1. Chest pain, unspecified type   2. HYPERLIPIDEMIA   3. Counseling on health promotion and disease prevention   4. Cardiac risk counseling   5. Encounter to discuss test results    PLAN:    Chest pain: reviewed CT test results again. Very helpful, shows that this is not due to any blockages in his heart. Reassuring. -ecg sinus bradycardia today -suspect non-cardiac cause of chest pain -reviewed red flag warning signs that need immediate medical attention  Hyperlipidemia:  -strong family history, which is likely his cause of elevated LDL -overall very good diet and exercise, though slipped somewhat due to pandemic -  calcium score of 0 very helpful in risk stratification (low risk) -lp(a) normal 2020 -hsCRP normal 2020  Primary prevention: CV risk counseling and prevention counseling -recommend heart healthy/Mediterranean diet, with whole grains, fruits, vegetable, fish, lean meats, nuts, and olive oil. Limit salt. -recommend moderate walking, 3-5 times/week for 30-50 minutes each session. Aim for at least 150 minutes.week. Goal should be pace of 3 miles/hours, or walking 1.5 miles in 30 minutes -recommend avoidance of tobacco products. Avoid excess alcohol. -Additional risk factor control:  -No history of diabetes  -Lipids: as above  -Blood pressure control: at goal, on no meds. Has rare occasional elevations but have not been persistent.  -Weight: BMI 26 -ASCVD risk score: The 10-year ASCVD risk score Mikey Bussing DC Brooke Bonito., et al., 2013) is: 2.2%   Values used to calculate the score:     Age: 48 years     Sex: Male     Is Non-Hispanic African American: No     Diabetic: No     Tobacco smoker: No     Systolic Blood Pressure: 123XX123 mmHg     Is BP treated: No     HDL Cholesterol: 52.2 mg/dL     Total Cholesterol: 179 mg/dL   Plan for follow up: 1 year per patient preference  Medication Adjustments/Labs and Tests Ordered: Current  medicines are reviewed at length with the patient today.  Concerns regarding medicines are outlined above.  Orders Placed This Encounter  Procedures  . EKG 12-Lead   No orders of the defined types were placed in this encounter.   Patient Instructions  Medication Instructions:  Your Physician recommend you continue on your current medication as directed.    *If you need a refill on your cardiac medications before your next appointment, please call your pharmacy*  Lab Work: None  Testing/Procedures: None  Follow-Up: At [facility], you and your health needs are our priority.  As part of our continuing mission to provide you with exceptional heart care, we have created designated Provider Care Teams.  These Care Teams include your primary Cardiologist (physician) and Advanced Practice Providers (APPs -  Physician Assistants and Nurse Practitioners) who all work together to provide you with the care you need, when you need it.  Your next appointment:   1 year(s)  The format for your next appointment:   In Person  Provider:   Buford Dresser, MD      Signed, Buford Dresser, MD PhD 01/23/2019 1:14 PM    Stephen Bridges

## 2019-06-15 ENCOUNTER — Other Ambulatory Visit: Payer: Self-pay

## 2019-06-15 ENCOUNTER — Encounter: Payer: Self-pay | Admitting: Family Medicine

## 2019-06-15 ENCOUNTER — Ambulatory Visit (INDEPENDENT_AMBULATORY_CARE_PROVIDER_SITE_OTHER): Payer: 59 | Admitting: Family Medicine

## 2019-06-15 VITALS — BP 116/80 | HR 66 | Temp 97.9°F | Ht 72.0 in | Wt 202.0 lb

## 2019-06-15 DIAGNOSIS — R1031 Right lower quadrant pain: Secondary | ICD-10-CM

## 2019-06-15 DIAGNOSIS — W57XXXA Bitten or stung by nonvenomous insect and other nonvenomous arthropods, initial encounter: Secondary | ICD-10-CM

## 2019-06-15 DIAGNOSIS — S80862A Insect bite (nonvenomous), left lower leg, initial encounter: Secondary | ICD-10-CM

## 2019-06-15 MED ORDER — DOXYCYCLINE HYCLATE 100 MG PO TABS: 200.0000 mg | ORAL_TABLET | Freq: Once | ORAL | 0 refills | Status: AC

## 2019-06-15 NOTE — Progress Notes (Signed)
Patient: Stephen Bridges MRN: [id number] DOB: 1970-07-01 PCP: Marin Olp, MD     Subjective:  Chief Complaint  Patient presents with  . Groin Pain    He says that it goes into his abd area. Certain movements create sharp pain. Also during coughing.     HPI: The patient is a 49 y.o. male who presents today for groin pain.  He states 3 weeks ago he started to get pain on the top of his foot and has been laying off of it and hasn't been running. He states when he was running he had soreness in both anterior hip/groin areas. Right before this, he was at a baseball tournament and lugged a tent up a hill. Around that time, he felt in his lower right abdomen he felt like he pulled something. He could only really feel it if he was stretching. A week ago his groin, closer to his testicle, it got progressively worse. When he coughs or sneezes he has an excruciating pain and goes away right away. He tries not to bear down, but when he does he doesn't notice any pain. His testicle is not larger, but he does feel like something is different, maybe even slightly tender. No surgeries, no hx of hernias. No fever/chills, no dysuria or penile discharge. Norma BM.    Pulled tick off his left thigh about 2 weeks ago, unsure how long it was attached. Unsure what type of tick it was. No skin changes.    Review of Systems  Respiratory: Negative for cough, shortness of breath and wheezing.   Gastrointestinal: Positive for abdominal pain.  Genitourinary: Negative for frequency, testicular pain and urgency.    Allergies Patient is allergic to omeprazole.  Past Medical History Patient  has a past medical history of Allergy, HLD (hyperlipidemia), Nosebleed, and Wheezing.  Surgical History Patient  has a past surgical history that includes Knee arthroscopy (Right, 1990) and Wisdom tooth extraction (2014).  Family History Pateint's family history includes Hypertension in his father; Leukemia in his  maternal uncle; Multiple sclerosis (age of onset: 20) in his mother; Multiple sclerosis (age of onset: 27) in his maternal uncle; Prostate cancer in his father; Thyroid disease in his father and paternal grandmother.  Social History Patient  reports that he has never smoked. He has never used smokeless tobacco. He reports current alcohol use. He reports that he does not use drugs.     Objective: Vitals:   06/15/19 0814  BP: 116/80  Pulse: 66  Temp: 97.9 F (36.6 C)  TempSrc: Temporal  SpO2: 98%  Weight: 202 lb (91.6 kg)  Height: 6' (1.829 m)    Body mass index is 27.4 kg/m.  Physical Exam Vitals reviewed.  Constitutional:      Appearance: Normal appearance. He is normal weight.  HENT:     Head: Normocephalic and atraumatic.  Pulmonary:     Effort: Pulmonary effort is normal.  Abdominal:     General: Abdomen is flat. Bowel sounds are normal.     Palpations: Abdomen is soft.     Tenderness: There is abdominal tenderness (righ lower inguinal area. slight bulge compared to left. NO obvious hernia on exam with valsalva in inguinal canal. ).  Genitourinary:    Penis: Normal.      Testes: Normal.  Neurological:     Mental Status: He is alert.        Assessment/plan: 1. Right groin pain Presents like hernia, but no obvious hernia on exam. Checking  ultrasound and will refer to surgeon if +. Precautions given to go to ER for incarceration. Want him to avoid heavy lifting/exertion/exercise until we get imaging back.  - US Pelvis Complete; Future  2. Insect bite of left lower extremity, initial encounter Doxy x 1 dose unsure how long tick was attached.      This visit occurred during the SARS-CoV-2 public health emergency.  Safety protocols were in place, including screening questions prior to the visit, additional usage of staff PPE, and extensive cleaning of exam room while observing appropriate contact time as indicated for disinfecting solutions.    Return if symptoms  worsen or fail to improve.    Orma Flaming, MD [place]   06/15/2019

## 2019-06-15 NOTE — Patient Instructions (Signed)
° °Inguinal Hernia, Adult °An inguinal hernia is when fat or your intestines push through a weak spot in a muscle where your leg meets your lower belly (groin). This causes a rounded lump (bulge). This kind of hernia could also be: °· In your scrotum, if you are male. °· In folds of skin around your vagina, if you are male. °There are three types of inguinal hernias. These include: °· Hernias that can be pushed back into the belly (are reducible). This type rarely causes pain. °· Hernias that cannot be pushed back into the belly (are incarcerated). °· Hernias that cannot be pushed back into the belly and lose their blood supply (are strangulated). This type needs emergency surgery. °If you do not have symptoms, you may not need treatment. If you have symptoms or a large hernia, you may need surgery. °Follow these instructions at home: °Lifestyle °· Do these things if told by your doctor so you do not have trouble pooping (constipation): °? Drink enough fluid to keep your pee (urine) pale yellow. °? Eat foods that have a lot of fiber. These include fresh fruits and vegetables, whole grains, and beans. °? Limit foods that are high in fat and processed sugars. These include foods that are fried or sweet. °? Take medicine for trouble pooping. °· Avoid lifting heavy objects. °· Avoid standing for long amounts of time. °· Do not use any products that contain nicotine or tobacco. These include cigarettes and e-cigarettes. If you need help quitting, ask your doctor. °· Stay at a healthy weight. °General instructions °· You may try to push your hernia in by very gently pressing on it when you are lying down. Do not try to force the bulge back in if it will not push in easily. °· Watch your hernia for any changes in shape, size, or color. Tell your doctor if you see any changes. °· Take over-the-counter and prescription medicines only as told by your doctor. °· Keep all follow-up visits as told by your doctor. This is  important. °Contact a doctor if: °· You have a fever. °· You have new symptoms. °· Your symptoms get worse. °Get help right away if: °· The area where your leg meets your lower belly has: °? Pain that gets worse suddenly. °? A bulge that gets bigger suddenly, and it does not get smaller after that. °? A bulge that turns red or purple. °? A bulge that is painful when you touch it. °· You are a man, and your scrotum: °? Suddenly feels painful. °? Suddenly changes in size. °· You cannot push the hernia in by very gently pressing on it when you are lying down. Do not try to force the bulge back in if it will not push in easily. °· You feel sick to your stomach (nauseous), and that feeling does not go away. °· You throw up (vomit), and that keeps happening. °· You have a fast heartbeat. °· You cannot poop (have a bowel movement) or pass gas. °These symptoms may be an emergency. Do not wait to see if the symptoms will go away. Get medical help right away. Call your local emergency services (911 in the [place] °Summary °· An inguinal hernia is when fat or your intestines push through a weak spot in a muscle where your leg meets your lower belly (groin). This causes a rounded lump (bulge). °· If you do not have symptoms, you may not need treatment. If you have symptoms or a large hernia,   you may need surgery. °· Avoid lifting heavy objects. Also avoid standing for long amounts of time. °· Do not try to force the bulge back in if it will not push in easily. °This information is not intended to replace advice given to you by your health care provider. Make sure you discuss any questions you have with your health care provider. °Document Revised: 01/26/2017 Document Reviewed: 09/26/2016 °Elsevier Patient Education © 2020 [employer] ° °

## 2019-06-22 ENCOUNTER — Other Ambulatory Visit: Payer: Self-pay | Admitting: Family Medicine

## 2019-06-22 ENCOUNTER — Ambulatory Visit
Admission: RE | Admit: 2019-06-22 | Discharge: 2019-06-22 | Disposition: A | Discharge status: Home / Self Care | Payer: 59 | Source: Ambulatory Visit | Attending: Family Medicine | Admitting: Family Medicine

## 2019-06-22 DIAGNOSIS — R1031 Right lower quadrant pain: Secondary | ICD-10-CM

## 2019-07-31 ENCOUNTER — Encounter: Payer: Self-pay | Admitting: Family Medicine

## 2019-08-07 ENCOUNTER — Encounter: Payer: Self-pay | Admitting: Physician Assistant

## 2019-08-07 ENCOUNTER — Other Ambulatory Visit: Payer: Self-pay

## 2019-08-07 ENCOUNTER — Ambulatory Visit (INDEPENDENT_AMBULATORY_CARE_PROVIDER_SITE_OTHER): Payer: 59 | Admitting: Physician Assistant

## 2019-08-07 VITALS — BP 128/74 | HR 84 | Temp 99.0°F | Ht 72.0 in | Wt 202.2 lb

## 2019-08-07 DIAGNOSIS — R202 Paresthesia of skin: Secondary | ICD-10-CM

## 2019-08-07 DIAGNOSIS — W57XXXD Bitten or stung by nonvenomous insect and other nonvenomous arthropods, subsequent encounter: Secondary | ICD-10-CM

## 2019-08-07 DIAGNOSIS — R200 Anesthesia of skin: Secondary | ICD-10-CM

## 2019-08-07 DIAGNOSIS — S80862D Insect bite (nonvenomous), left lower leg, subsequent encounter: Secondary | ICD-10-CM
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
## 2019-08-07 NOTE — Patient Instructions (Signed)
It was great to see you!  Work on more water (at least 64 ounces of plain water daily) Try to work on your posture Consider doing neck stretches  I will be in touch with your labs.  Let's follow-up next week with Dr. Yong Channel, sooner if you have concerns.  Take care,  Inda Coke PA-C

## 2019-08-08 LAB — CBC WITH DIFFERENTIAL/PLATELET
Absolute Monocytes: 270 cells/uL (ref 200–950)
Basophils Absolute: 31 cells/uL (ref 0–200)
Basophils Relative: 0.6 %
Eosinophils Absolute: 20 cells/uL (ref 15–500)
Eosinophils Relative: 0.4 %
HCT: 43.6 % (ref 38.5–50.0)
Hemoglobin: 14.8 g/dL (ref 13.2–17.1)
Lymphs Abs: 1193 cells/uL (ref 850–3900)
MCH: 30 pg (ref 27.0–33.0)
MCHC: 33.9 g/dL (ref 32.0–36.0)
MCV: 88.3 fL (ref 80.0–100.0)
MPV: 10.6 fL (ref 7.5–12.5)
Monocytes Relative: 5.3 %
Neutro Abs: 3585 cells/uL (ref 1500–7800)
Neutrophils Relative %: 70.3 %
Platelets: 243 10*3/uL (ref 140–400)
RBC: 4.94 10*6/uL (ref 4.20–5.80)
RDW: 12.8 % (ref 11.0–15.0)
Total Lymphocyte: 23.4 %
WBC: 5.1 10*3/uL (ref 3.8–10.8)

## 2019-08-08 LAB — HEMOGLOBIN A1C
Hgb A1c MFr Bld: 5.4 % of total Hgb (ref <5.7)
Mean Plasma Glucose: 108 (calc)
eAG (mmol/L): 6 (calc)

## 2019-08-08 LAB — FERRITIN: Ferritin: 82 ng/mL (ref 38–380)

## 2019-08-08 LAB — COMPREHENSIVE METABOLIC PANEL
AG Ratio: 1.7 (calc) (ref 1.0–2.5)
ALT: 21 U/L (ref 9–46)
AST: 30 U/L (ref 10–40)
Albumin: 4.7 g/dL (ref 3.6–5.1)
Alkaline phosphatase (APISO): 60 U/L (ref 36–130)
BUN: 18 mg/dL (ref 7–25)
CO2: 28 mmol/L (ref 20–32)
Calcium: 9.6 mg/dL (ref 8.6–10.3)
Chloride: 106 mmol/L (ref 98–110)
Creat: 1.14 mg/dL (ref 0.60–1.35)
Globulin: 2.7 g/dL (calc) (ref 1.9–3.7)
Glucose, Bld: 90 mg/dL (ref 65–99)
Potassium: 4.2 mmol/L (ref 3.5–5.3)
Sodium: 141 mmol/L (ref 135–146)
Total Bilirubin: 0.6 mg/dL (ref 0.2–1.2)
Total Protein: 7.4 g/dL (ref 6.1–8.1)

## 2019-08-08 LAB — URINALYSIS, ROUTINE W REFLEX MICROSCOPIC
Bilirubin Urine: NEGATIVE
Glucose, UA: NEGATIVE
Hgb urine dipstick: NEGATIVE
Leukocytes,Ua: NEGATIVE
Nitrite: NEGATIVE
Protein, ur: NEGATIVE
Specific Gravity, Urine: 1.028 (ref 1.001–1.03)
pH: 6.5 (ref 5.0–8.0)

## 2019-08-08 LAB — TSH: TSH: 1.98 mIU/L (ref 0.40–4.50)

## 2019-08-08 LAB — VITAMIN B12: Vitamin B-12: 573 pg/mL (ref 200–1100)

## 2019-08-08 LAB — ANA: Anti Nuclear Antibody (ANA): NEGATIVE

## 2019-08-14 ENCOUNTER — Other Ambulatory Visit: Payer: Self-pay

## 2019-08-14 ENCOUNTER — Ambulatory Visit (INDEPENDENT_AMBULATORY_CARE_PROVIDER_SITE_OTHER): Payer: 59 | Admitting: Family Medicine

## 2019-08-14 ENCOUNTER — Encounter: Payer: Self-pay | Admitting: Family Medicine

## 2019-08-14 VITALS — BP 124/88 | HR 90 | Temp 98.7°F | Ht 72.0 in | Wt 203.0 lb

## 2019-08-14 DIAGNOSIS — R202 Paresthesia of skin: Secondary | ICD-10-CM | POA: Diagnosis not present

## 2019-08-14 DIAGNOSIS — R200 Anesthesia of skin: Secondary | ICD-10-CM

## 2019-08-14 DIAGNOSIS — S80862D Insect bite (nonvenomous), left lower leg, subsequent encounter: Secondary | ICD-10-CM

## 2019-08-14 DIAGNOSIS — W57XXXD Bitten or stung by nonvenomous insect and other nonvenomous arthropods, subsequent encounter: Secondary | ICD-10-CM

## 2019-08-14 NOTE — Patient Instructions (Addendum)
Health Maintenance Due  Topic Date Due  . COVID-19 Vaccine (1)-this evening Never done  . TETANUS/TDAP -will wait until covid vaccine- at least 2 weeks after 2nd vaccine 05/25/2019   The first thing I want you to do is to try to increase your sleep to at least 7 hours a night if not 8 hours a night.  If at all possible cutting back at work could also be helpful-stress/poor sleep can trigger many physical symptoms  I also think you already did an excellent work-up with Aldona Bar for blood work.  Lyme was not tested at that time but as we discussed this test is not super accurate-if you change your mind and want to do this in the future we can always order it later.  We discussed possibly referring to neurology-if you would like to do this at a future time such as if symptoms linger or worsen despite improved sleep please let me know

## 2019-08-14 NOTE — Progress Notes (Signed)
Phone [phone] In person visit   Subjective:   Stephen Bridges is a 49 y.o. year old very pleasant male patient who presents for/with See problem oriented charting Chief Complaint  Patient presents with  . Follow-up    This visit occurred during the SARS-CoV-2 public health emergency.  Safety protocols were in place, including screening questions prior to the visit, additional usage of staff PPE, and extensive cleaning of exam room while observing appropriate contact time as indicated for disinfecting solutions.   Past Medical History-  Patient Active Problem List   Diagnosis Date Noted  . GAD (generalized anxiety disorder) 10/08/2017    Priority: Medium  . History of adenomatous polyp of colon 10/03/2016    Priority: Medium  . Ocular migraine 12/27/2015    Priority: Medium  . HYPERLIPIDEMIA 11/04/2006    Priority: Medium  . Atypical chest pain 11/04/2006    Priority: Medium  . Chronic cough 06/12/2012    Priority: Low  . Lipoma 06/12/2012    Priority: Low  . ALLERGIC RHINITIS 11/04/2006    Priority: Low  . GERD 11/04/2006    Priority: Low  . Otitis media with effusion 02/02/2017    Medications- reviewed and updated Current Outpatient Medications  Medication Sig Dispense Refill  . CINNAMON PO Take by mouth.    . CO-ENZYME Q10 PO Take by mouth.     . Garlic 270 MG TABS Take by mouth.    . Multiple Vitamin (MULTIVITAMIN) tablet Take 1 tablet by mouth daily.    . Omega-3 Fatty Acids (FISH OIL) 1000 MG CAPS Take 1 capsule by mouth daily.    . saw palmetto 80 MG capsule Take 80 mg by mouth 2 (two) times daily.    . Turmeric (QC TUMERIC COMPLEX) 500 MG CAPS Take by mouth.     No current facility-administered medications for this visit.     Objective:  BP 124/88   Pulse 90   Temp 98.7 F (37.1 C)   Ht 6' (1.829 m)   Wt 203 lb (92.1 kg)   SpO2 97%   BMI 27.53 kg/m  Gen: NAD, resting comfortably CV: RRR no murmurs rubs or gallops Lungs: CTAB no crackles,  wheeze, rhonchi Ext: no edema Skin: warm, dry-no abnormality over left thigh-she has a picture with red slightly raised area almost the size of a fingernail from his bone Neuro: CN II-XII intact, sensation and reflexes normal throughout, 5/5 muscle strength in bilateral upper and lower extremities. Normal finger to nose. Normal rapid alternating movements. No pronator drift. Normal romberg. Normal gait.      Assessment and Plan   #Tick Bite/numbness/tingling S:.Tick bite in mayf/u on tick bite that keeps swelling up randomly. Never had systemic symptoms or target lesion. Saw Dr. Rogers Blocker in June for tick bite as well as groin pain. Groin pain is improving. Ultrasound normal with no obvious hernia. Still working way back into running.   Since tick bite developed new numbness/tingling symptoms. Numbness and tingling in hands and feet over last 4 weeks. Last night felt warmth on top of left foot. Pins and needles feeling in bottom of foot  At time. Creepy/crawly finger up leg at times. Also with a tingling sensation around the nose, chins, lips at times. History of carpal tunnel  -this feels different- numbness in fingertips and creepy/crawly sensation into hand. Numbness gets better with long walk. MS does run in his family.   He reports working a lot of hours right now and sleep is  not the best. Ocular migraines slightly more frequent. Areas tend to go to sleep more quickly.  A/P: Patient interestingly has recurrent slight swelling at prior tick bite site perhaps once a month since May.  1 of these has some lingering inflammation in the area.  Almost looks like a slight welt/singular hive.  We discussed possible referral to dermatology and could have area examined/possibly biopsied.  I think the changes overall are likely benign though and we opted to monitor  In regards to the numbness/tingling in hands and feet (reassuring neurological exam again today) that started over the last month-patient has  been extremely stressed working over 12 hours a day and getting very little sleep.  We discussed first up of increasing sleep and try to cut down on work hours if possible.  He was concerned about possible Lyme disease but never had systemic symptoms or target lesion-discussed Lyme testing with poor accuracy and I am not sure he would be additionally beneficial.  At excellent blood work/work-up with Inda Coke, PA already-I reviewed those labs today. -Family history of MS.  I do not strongly suspect MS but offered referral to neurology for their opinion on neuropathic symptoms and possible MS-he declines for now but if has new or worsening symptoms let us know  I am thankful groin pain is improving and hopefully he can get back to running eventually-he is walking for now.  He is going to work on healthy eating/regular exercise to get weight back down  Recommended follow up: We have a physical in December planned Future Appointments  Date Time Provider [place]  12/24/2019  8:00 AM Marin Olp, MD [facility]    Lab/Order associations:   ICD-10-CM   1. Numbness and tingling  R20.0    R20.2   2. Insect bite of left lower extremity, subsequent encounter  X79.390Z    W57.XXXD    Time Spent: 34 minutes of total time (2:39 PM- 3:13 PM) was spent on the date of the encounter performing the following actions: chart review prior to seeing the patient, obtaining history, performing a medically necessary exam, counseling on the treatment plan, placing orders, and documenting in our EHR.   Return precautions advised.  Garret Reddish, MD
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
## 2019-12-24 ENCOUNTER — Other Ambulatory Visit: Payer: Self-pay | Admitting: *Deleted

## 2019-12-24 ENCOUNTER — Ambulatory Visit (INDEPENDENT_AMBULATORY_CARE_PROVIDER_SITE_OTHER): Payer: 59 | Admitting: Family Medicine

## 2019-12-24 ENCOUNTER — Other Ambulatory Visit: Payer: Self-pay

## 2019-12-24 ENCOUNTER — Encounter: Payer: Self-pay | Admitting: Family Medicine

## 2019-12-24 VITALS — BP 126/73 | HR 60 | Temp 97.7°F | Ht 72.0 in | Wt 203.2 lb

## 2019-12-24 DIAGNOSIS — Z1159 Encounter for screening for other viral diseases: Secondary | ICD-10-CM | POA: Diagnosis not present

## 2019-12-24 DIAGNOSIS — Z125 Encounter for screening for malignant neoplasm of prostate: Secondary | ICD-10-CM

## 2019-12-24 DIAGNOSIS — Z23 Encounter for immunization: Secondary | ICD-10-CM

## 2019-12-24 DIAGNOSIS — Z0000 Encounter for general adult medical examination without abnormal findings: Secondary | ICD-10-CM

## 2019-12-24 DIAGNOSIS — Z1283 Encounter for screening for malignant neoplasm of skin: Secondary | ICD-10-CM

## 2019-12-24 DIAGNOSIS — E782 Mixed hyperlipidemia: Secondary | ICD-10-CM

## 2019-12-24 NOTE — Addendum Note (Signed)
Addended by: Linton Ham on: 12/24/2019 08:55 AM   Modules accepted: Orders

## 2019-12-24 NOTE — Patient Instructions (Addendum)
Health Maintenance Due  Topic Date Due  . TETANUS/TDAP today 05/25/2019   We will call you within two weeks about your referral to dermatology. If you do not hear within 3 weeks, give [place] a call.   Great to see you today! Have a Merry Christmas!  Please stop by lab before you go If you have mychart- we will send your results within 3 business days of [place] receiving them.  If you do not have mychart- we will call you about results within 5 business days of [place] receiving them.  *please note we are currently using [facility] which has a longer processing time than [facility] typically so labs may not come back as quickly as in the past *please also note that you will see labs on mychart as soon as they post. I will later go in and write notes on them- will say "notes from Dr. Yong Channel"  Recommended follow up: Return in about 1 year (around 12/23/2020) for physical or sooner if needed.

## 2019-12-25 LAB — COMPLETE METABOLIC PANEL WITH GFR
AG Ratio: 2.1 (calc) (ref 1.0–2.5)
ALT: 31 U/L (ref 9–46)
AST: 36 U/L (ref 10–40)
Albumin: 4.4 g/dL (ref 3.6–5.1)
Alkaline phosphatase (APISO): 58 U/L (ref 36–130)
BUN: 23 mg/dL (ref 7–25)
CO2: 25 mmol/L (ref 20–32)
Calcium: 8.9 mg/dL (ref 8.6–10.3)
Chloride: 104 mmol/L (ref 98–110)
Creat: 0.94 mg/dL (ref 0.60–1.35)
GFR, Est African American: 110 mL/min/{1.73_m2} (ref >=60)
GFR, Est Non African American: 95 mL/min/{1.73_m2} (ref >=60)
Globulin: 2.1 g/dL (calc) (ref 1.9–3.7)
Glucose, Bld: 86 mg/dL (ref 65–99)
Potassium: 4.1 mmol/L (ref 3.5–5.3)
Sodium: 136 mmol/L (ref 135–146)
Total Bilirubin: 0.4 mg/dL (ref 0.2–1.2)
Total Protein: 6.5 g/dL (ref 6.1–8.1)

## 2019-12-25 LAB — CBC WITH DIFFERENTIAL/PLATELET
Absolute Monocytes: 281 cells/uL (ref 200–950)
Basophils Absolute: 31 cells/uL (ref 0–200)
Basophils Relative: 0.8 %
Eosinophils Absolute: 51 cells/uL (ref 15–500)
Eosinophils Relative: 1.3 %
HCT: 40.3 % (ref 38.5–50.0)
Hemoglobin: 13.8 g/dL (ref 13.2–17.1)
Lymphs Abs: 1158 cells/uL (ref 850–3900)
MCH: 30.7 pg (ref 27.0–33.0)
MCHC: 34.2 g/dL (ref 32.0–36.0)
MCV: 89.6 fL (ref 80.0–100.0)
MPV: 10.8 fL (ref 7.5–12.5)
Monocytes Relative: 7.2 %
Neutro Abs: 2379 cells/uL (ref 1500–7800)
Neutrophils Relative %: 61 %
Platelets: 244 10*3/uL (ref 140–400)
RBC: 4.5 10*6/uL (ref 4.20–5.80)
RDW: 13 % (ref 11.0–15.0)
Total Lymphocyte: 29.7 %
WBC: 3.9 10*3/uL (ref 3.8–10.8)

## 2019-12-25 LAB — LIPID PANEL W/REFLEX DIRECT LDL
Cholesterol: 200 mg/dL — ABNORMAL HIGH (ref <200)
HDL: 47 mg/dL (ref >=40)
LDL Cholesterol (Calc): 134 mg/dL (calc) — ABNORMAL HIGH
Non-HDL Cholesterol (Calc): 153 mg/dL (calc) — ABNORMAL HIGH (ref <130)
Total CHOL/HDL Ratio: 4.3 (calc) (ref <5.0)
Triglycerides: 88 mg/dL (ref <150)

## 2019-12-25 LAB — HEPATITIS C ANTIBODY
Hepatitis C Ab: NONREACTIVE
SIGNAL TO CUT-OFF: 0.01 (ref <1.00)

## 2019-12-25 LAB — PSA: PSA: 2.74 ng/mL (ref <=4.0)

## 2020-01-29 ENCOUNTER — Other Ambulatory Visit: Payer: Self-pay

## 2020-01-29 ENCOUNTER — Ambulatory Visit (INDEPENDENT_AMBULATORY_CARE_PROVIDER_SITE_OTHER): Payer: 59 | Admitting: Cardiology

## 2020-01-29 ENCOUNTER — Encounter: Payer: Self-pay | Admitting: Cardiology

## 2020-01-29 VITALS — BP 128/74 | HR 65 | Ht 72.0 in | Wt 210.0 lb

## 2020-01-29 DIAGNOSIS — Z8249 Family history of ischemic heart disease and other diseases of the circulatory system: Secondary | ICD-10-CM | POA: Diagnosis not present

## 2020-01-29 DIAGNOSIS — Z7189 Other specified counseling: Secondary | ICD-10-CM

## 2020-01-29 DIAGNOSIS — E7800 Pure hypercholesterolemia, unspecified: Secondary | ICD-10-CM

## 2020-01-29 DIAGNOSIS — R072 Precordial pain: Secondary | ICD-10-CM | POA: Diagnosis not present

## 2020-01-29 NOTE — Progress Notes (Signed)
Cardiology Office Note:    Date:  01/29/2020   ID:  Stephen Bridges, DOB 1970/12/02, MRN [id number]  PCP:  Marin Olp, MD  Cardiologist:  Buford Dresser, MD PhD  Referring MD: Marin Olp, MD   CC: follow up  History of Present Illness:    Stephen Bridges is a 50 y.o. male with a hx of family history of CV disease, hyperlipidemia, anxiety who is seen for follow up. I initially saw him 11/22/2017 as a new consult at the request of Marin Olp, MD for the evaluation and management of chest pain.  Cardiac history: several years of sharp/tense left sided chest pain with occasional radiation to neck and arms. Worse with stress.  -cardiac CTA 01/14/18 normal, no calcium -high cholesterol, never been on medications. Highest is LDL 177, lowest 143. Totals over 200, highest 240.  -POET normal 02/06/2017,  -Family history: high cholesterol in father, brother, maternal aunts. [name] with aneurysm, [name] with stroke. Mat Gpa with emphysema, Mat Gma with liver failure. Mom has multiple sclerosis or similar. No early onset CAD, no sudden cardiac death.  Today: Gained some weight during the pandemic. Working from home, actually working more than he had been. Not sleeping a lot. Continues to have intermittent chest pain the same as prior, worse when he is stressed. Feels tired, rarely dizzy. Doesn't drink a lot of water, drinks a lot of coffee. Has been working out several days/week, doing more lifting than cardio due to knee/ankle injuries. Still running intermittently.  Has slipped on his diet, not eating well. Trying to get back on healthy eating plan.   Denies shortness of breath at rest or with normal exertion. No PND, orthopnea, LE edema. No syncope or palpitations.  ROS positive for postnasal drip/chronic cough.   Both children are doing home school, wife goes into work, so he can't just leave and go to the gym during the day.  Past Medical History:  Diagnosis Date  .  Allergy    allergic rhinitis  . HLD (hyperlipidemia)   . Nosebleed    history of nosebleeds as child  . Wheezing    exercise induced    Past Surgical History:  Procedure Laterality Date  . KNEE ARTHROSCOPY Right 1990    wrestling injury  . WISDOM TOOTH EXTRACTION  2014    Current Medications: Current Outpatient Medications on File Prior to Visit  Medication Sig  . CINNAMON PO Take by mouth.  . Garlic 767 MG TABS Take by mouth.  . Multiple Vitamin (MULTIVITAMIN) tablet Take 1 tablet by mouth daily.  . Omega-3 Fatty Acids (FISH OIL) 1000 MG CAPS Take 1 capsule by mouth daily.  . saw palmetto 80 MG capsule Take 80 mg by mouth 2 (two) times daily.  . Turmeric 500 MG CAPS Take by mouth.   No current facility-administered medications on file prior to visit.     Allergies:   Omeprazole   Social History   Tobacco Use  . Smoking status: Never Smoker  . Smokeless tobacco: Never Used  Substance Use Topics  . Alcohol use: Yes    Comment: occasional  . Drug use: No    Family History: The patient's family history includes Hypertension in his father; Leukemia in his maternal uncle; Multiple sclerosis (age of onset: 8) in his mother; Multiple sclerosis (age of onset: 59) in his maternal uncle; Prostate cancer in his father; Thyroid disease in his father and paternal grandmother. There is no history  of Colon cancer. high cholesterol in father, brother, maternal aunts. [name] with aneurysm, [name] with stroke. Mat Gpa with emphysema, Mat Gma with liver failure. Mom has multiple sclerosis or similar. No early onset, no sudden cardiac death.  ROS:   Please see the history of present illness.  Additional ROS negative except as documented in HPI.    EKGs/Labs/Other Studies Reviewed:    The following studies were reviewed today: CT cardiac 01/13/18 1. Coronary artery calcium score 0 Agatston units, suggesting low risk for future cardiac events. 2.  No significant coronary disease  noted.  POET 02/06/17  Blood pressure demonstrated a normal response to exercise.  No T wave inversion was noted during stress.  Upsloping ST segment depression ST segment depression was noted during stress in the V4, V6, V5, II, III and aVF leads, and returning to baseline after less than 1 minute of recovery.  Negative, adequate stress test. Exercise Time  Exercise duration (min) 11 min    Exercise duration (sec) 0 sec    Peak Stress Vitals  Peak HR 179 bpm    Peak BP 175/81 mmHg    Exercise Data  MPHR 174 bpm    Percent HR 102 %    RPE 16     Estimated workload 13.4 METS      EKG:  EKG is personally reviewed.  The ekg ordered today demonstrates sinus rhythm, HR 65 bpm  Recent Labs: 08/07/2019: TSH 1.98 12/24/2019: ALT 31; BUN 23; Creat 0.94; Hemoglobin 13.8; Platelets 244; Potassium 4.1; Sodium 136  Recent Lipid Panel    Component Value Date/Time   CHOL 200 (H) 12/24/2019 0852   CHOL 187 01/10/2018 1156   TRIG 88 12/24/2019 0852   HDL 47 12/24/2019 0852   HDL 55 01/10/2018 1156   CHOLHDL 4.3 12/24/2019 0852   VLDL 12.0 12/17/2018 0857   LDLCALC 134 (H) 12/24/2019 0852   LDLDIRECT 148.9 10/24/2012 0943    Physical Exam:    VS:  BP 128/74 (BP Location: Left Arm, Patient Position: Sitting, Cuff Size: Normal)   Pulse 65   Ht 6' (1.829 m)   Wt 210 lb (95.3 kg)   BMI 28.48 kg/m     Wt Readings from Last 3 Encounters:  01/29/20 210 lb (95.3 kg)  12/24/19 203 lb 3.2 oz (92.2 kg)  08/14/19 203 lb (92.1 kg)    GEN: Well nourished, well developed in no acute distress HEENT: Normal, moist mucous membranes NECK: No JVD CARDIAC: regular rhythm, normal S1 and S2, no rubs or gallops. No murmur. VASCULAR: Radial and DP pulses 2+ bilaterally. No carotid bruits RESPIRATORY:  Clear to auscultation without rales, wheezing or rhonchi  ABDOMEN: Soft, non-tender, non-distended MUSCULOSKELETAL:  Ambulates independently SKIN: Warm and dry, no edema NEUROLOGIC:  Alert  and oriented x 3. No focal neuro deficits noted. PSYCHIATRIC:  Normal affect   ASSESSMENT:    1. Precordial pain   2. Family history of heart disease   3. Counseling on health promotion and disease prevention   4. Cardiac risk counseling   5. Pure hypercholesterolemia    PLAN:    Chest pain: see prior notes, noncardiac in etiology -ecg sinus today -reviewed red flag warning signs that need immediate medical attention  Hypercholesterolemia:  -strong family history, which is likely his cause of elevated LDL -overall very good diet and exercise, though slipped somewhat due to pandemic -calcium score of 0 very helpful in risk stratification (low risk) -lp(a) normal 2020 -hsCRP normal 2020 -  no indication for statin given calcium score of 0  Primary prevention: CV risk counseling and prevention counseling, with family history of heart disease -recommend heart healthy/Mediterranean diet, with whole grains, fruits, vegetable, fish, lean meats, nuts, and olive oil. Limit salt. -recommend moderate walking, 3-5 times/week for 30-50 minutes each session. Aim for at least 150 minutes.week. Goal should be pace of 3 miles/hours, or walking 1.5 miles in 30 minutes -recommend avoidance of tobacco products. Avoid excess alcohol. -ASCVD risk score: The 10-year ASCVD risk score Mikey Bussing DC Brooke Bonito., et al., 2013) is: 3.3%   Values used to calculate the score:     Age: 48 years     Sex: Male     Is Non-Hispanic African American: No     Diabetic: No     Tobacco smoker: No     Systolic Blood Pressure: 417 mmHg     Is BP treated: No     HDL Cholesterol: 47 mg/dL     Total Cholesterol: 200 mg/dL   Plan for follow up: 1 year per patient preference  Medication Adjustments/Labs and Tests Ordered: Current medicines are reviewed at length with the patient today.  Concerns regarding medicines are outlined above.  Orders Placed This Encounter  Procedures  . EKG 12-Lead   No orders of the defined types were  placed in this encounter.   Patient Instructions  Medication Instructions:  Your Physician recommend you continue on your current medication as directed.    *If you need a refill on your cardiac medications before your next appointment, please call your pharmacy*   Lab Work: None  Testing/Procedures: None   Follow-Up: At [facility], you and your health needs are our priority.  As part of our continuing mission to provide you with exceptional heart care, we have created designated Provider Care Teams.  These Care Teams include your primary Cardiologist (physician) and Advanced Practice Providers (APPs -  Physician Assistants and Nurse Practitioners) who all work together to provide you with the care you need, when you need it.  We recommend signing up for the patient portal called "MyChart".  Sign up information is provided on this After Visit Summary.  MyChart is used to connect with patients for Virtual Visits (Telemedicine).  Patients are able to view lab/test results, encounter notes, upcoming appointments, etc.  Non-urgent messages can be sent to your provider as well.   To learn more about what you can do with MyChart, go to [email or web address].    Your next appointment:   1 year(s)  The format for your next appointment:   In Person  Provider:   Buford Dresser, MD       Signed, Buford Dresser, MD PhD 01/29/2020 9:32 AM    [place]

## 2020-01-29 NOTE — Patient Instructions (Signed)

## 2020-09-15 ENCOUNTER — Other Ambulatory Visit: Payer: Self-pay | Admitting: Urology

## 2020-09-15 DIAGNOSIS — R972 Elevated prostate specific antigen [PSA]: Secondary | ICD-10-CM

## 2020-10-11 ENCOUNTER — Other Ambulatory Visit: Payer: Self-pay

## 2020-10-11 ENCOUNTER — Ambulatory Visit
Admission: RE | Admit: 2020-10-11 | Discharge: 2020-10-11 | Disposition: A | Discharge status: Home / Self Care | Payer: 59 | Source: Ambulatory Visit | Attending: Urology | Admitting: Urology

## 2020-10-11 DIAGNOSIS — R972 Elevated prostate specific antigen [PSA]: Secondary | ICD-10-CM

## 2020-10-11 MED ORDER — GADOBENATE DIMEGLUMINE 529 MG/ML IV SOLN
20.0000 mL | Freq: Once | INTRAVENOUS | Status: AC | PRN
  Administered 2020-10-11: 20 mL via INTRAVENOUS

## 2020-12-22 NOTE — Progress Notes (Incomplete)
Phone: [phone]   Subjective:  Patient presents today for their annual physical. Chief complaint-noted.   See problem oriented charting- ROS- full  review of systems was completed and negative  except for: ***  The following were reviewed and entered/updated in epic: Past Medical History:  Diagnosis Date   Allergy    allergic rhinitis   HLD (hyperlipidemia)    Nosebleed    history of nosebleeds as child   Wheezing    exercise induced   Patient Active Problem List   Diagnosis Date Noted   GAD (generalized anxiety disorder) 10/08/2017   Otitis media with effusion 02/02/2017   History of adenomatous polyp of colon 10/03/2016   Ocular migraine 12/27/2015   Chronic cough 06/12/2012   Lipoma 06/12/2012   HYPERLIPIDEMIA 11/04/2006   ALLERGIC RHINITIS 11/04/2006   GERD 11/04/2006   Atypical chest pain 11/04/2006   Past Surgical History:  Procedure Laterality Date   KNEE ARTHROSCOPY Right 1990    wrestling injury   [place] EXTRACTION  2014    Family History  Problem Relation Age of Onset   Multiple sclerosis Mother 69   Prostate cancer Father        39s or 64s   Hypertension Father    Thyroid disease Father    Multiple sclerosis Maternal Uncle 57   Leukemia Maternal Uncle    Thyroid disease Paternal Grandmother    Colon cancer Neg Hx     Medications- reviewed and updated Current Outpatient Medications  Medication Sig Dispense Refill   CINNAMON PO Take by mouth.     Garlic 119 MG TABS Take by mouth.     Multiple Vitamin (MULTIVITAMIN) tablet Take 1 tablet by mouth daily.     Omega-3 Fatty Acids (FISH OIL) 1000 MG CAPS Take 1 capsule by mouth daily.     saw palmetto 80 MG capsule Take 80 mg by mouth 2 (two) times daily.     Turmeric 500 MG CAPS Take by mouth.     No current facility-administered medications for this visit.    Allergies-reviewed and updated Allergies  Allergen Reactions   Omeprazole Rash    Social History   Social History  Narrative   Married--2 sons (see his wife kim as well) Sons 10 and 7 in 2017.       Occupation: Radio producer (Forensic psychologist) at [employer]   Objective  Objective:  There were no vitals taken for this visit. Gen: NAD, resting comfortably HEENT: Mucous membranes are moist. Oropharynx normal Neck: no thyromegaly CV: RRR no murmurs rubs or gallops Lungs: CTAB no crackles, wheeze, rhonchi Abdomen: soft/nontender/nondistended/normal bowel sounds. No rebound or guarding.  Ext: no edema Skin: warm, dry Neuro: grossly normal, moves all extremities, PERRLA ***   Assessment and Plan  50 y.o. male presenting for annual physical.  Health Maintenance counseling: 1. Anticipatory guidance: Patient counseled regarding regular dental exams -***q6 months, eye exams -***yearly,  avoiding smoking and second hand smoke*** , limiting alcohol to 2 beverages per day -perhaps 2-3 drinks every few months***, no illicit drug use***.   2. Risk factor reduction:  Advised patient of need for regular exercise and diet rich and fruits and vegetables to reduce risk of heart attack and stroke.  Exercise- last year he was running at least 5 days per week - tried to watch his miles. Also had access to gym again***. Diet/weight management- Weight is *** by ***lbs since last year. In the past felt he could cut back on carbs and  late night eating and stress eating***.  Wt Readings from Last 3 Encounters:  01/29/20 210 lb (95.3 kg)  12/24/19 203 lb 3.2 oz (92.2 kg)  08/14/19 203 lb (92.1 kg)   3. Immunizations/screenings/ancillary studies- discuss Shingrix-***, Omicron/Bivalent booster-***, and Flu shot-*** - otherwise immunizations up-to-date. Immunization History  Administered Date(s) Administered   Influenza Inj Mdck Quad Pf 10/29/2018   Influenza Whole 12/12/2009   Influenza,inj,Quad PF,6+ Mos 11/05/2012, 10/21/2013, 11/19/2014, 12/27/2015, 12/27/2016, 10/08/2017   Influenza-Unspecified 09/10/2019   MMR  05/28/2009   PFIZER(Purple Top)SARS-COV-2 Vaccination 10/14/2019, 11/04/2019   Td 05/24/2009   Tdap 12/24/2019   Health Maintenance Due  Topic Date Due   Pneumococcal Vaccine 23-61 Years old (1 - PCV) Never done   Zoster Vaccines- Shingrix (1 of 2) Never done   COVID-19 Vaccine (3 - Pfizer risk series) 12/02/2019   INFLUENZA VACCINE  08/08/2020   4. Prostate cancer screening- patient is followed by urology yearly due to father with prostate cancer in 80s or 58s. We check his PSA annually and then he follows up with urology each March. Patient prefers rectal exams in both locations-***   Lab Results  Component Value Date   PSA 2.74 12/24/2019   PSA 3.53 12/17/2018   PSA 2.12 12/11/2017   5. Colon cancer screening - last colonoscopy done 09/24/2016 with a 5-year repeat recommended -  Next due 2023 with history of adenomatous colon polyps*** 6. Skin cancer screening- follows with [facility] - Lavell Luster, NP***. Advised regular sunscreen use. Denies worrisome, changing, or new skin lesions.  7. Smoking associated screening (lung cancer screening, AAA screen 65-75, UA)- never smoker 8. STD screening - patient opts ***out as monogamous  Status of chronic or acute concerns   #hyperlipidemia S: Medication:***None. 10-year ASCVD risk under 3% typically Lab Results  Component Value Date   CHOL 200 (H) 12/24/2019   HDL 47 12/24/2019   LDLCALC 134 (H) 12/24/2019   LDLDIRECT 148.9 10/24/2012   TRIG 88 12/24/2019   CHOLHDL 4.3 12/24/2019   A/P: ***   #Generalized anxiety disorder S: Recommended CBT October 2019. Fortunately patient symptoms improved with decreased travel schedule-he did not end up getting therapy/counseling Counseling: *** GAD 7 : Generalized Anxiety Score 10/08/2017  Nervous, Anxious, on Edge 1  Control/stop worrying 1  Worry too much - different things 2  Trouble relaxing 2  Restless 2  Easily annoyed or irritable 1  Afraid - awful might happen 2   Total GAD 7 Score 11  Anxiety Difficulty Somewhat difficult   A/P: ***   #Ocular migraines-continues follow-up with [facility] regularly and also sees Dr. Valetta Close locally. Denies change in symptoms or frequency***   #Chronic cough S: Ongoing cough issues since at least 2010. Chest x-ray in 2014 with Dr. Shawna Orleans. Apparently was also tested with PFTs and negative for asthma.  In the past thought most likely related to postnasal drip. Did not tolerate trial of PPI-reports allergy to omeprazole. Zantac was not helpful with cough.  Most recently patient has not wanted to try more therapy-potential options would include Pepcid for reflux portion or saline or Flonase or Claritin if desired.***   Other ENT concerns include tinnitus both ears***  A/P: ***   #Lipoma on right side-has been stable but patient would like to consider surgery in future potentially***   #Family history of prostate cancer in father in 41s or 60s***   #See note August 14, 2019-patient seen in August for monthly swelling around a prior tick  bite from May 2021 looks almost like a welt or hive. I discussed possible dermatology referral but he declined at that time. He also had developed numbness/tingling in his hands and feet over the last 4 weeks prior to that visit that felt different from his typical carpal tunnel. Numbness/tingling improved with long walks. There is a family history of MS (mother and maternal uncle). Patient was also working a lot of hours at work impairment of stress. He had reassuring neurological exam at that time. Never had systemic symptoms to suggest Lyme disease. We offered referral to neurology which he declined. -no recurrent swelling around prior tick bite but not wearing shorts so may be missing it -numbness/tingling intermittently still. Tends to get cold hands in general- has been told possibly raynauds- mainly feet and hands. Does not want further workup right now***  #cardiology- sees for  palpitations or chest pain- usually stres srelated  Recommended follow up: No follow-ups on file. Future Appointments  Date Time Provider [place]  12/27/2020  8:00 AM Marin Olp, MD [facility]    No chief complaint on file.  Lab/Order associations:*** fasting   ICD-10-CM   1. Preventative health care  Z00.00       No orders of the defined types were placed in this encounter.  I,Harris Phan,acting as a Education administrator for Garret Reddish, MD.,have documented all relevant documentation on the behalf of Garret Reddish, MD,as directed by  Garret Reddish, MD while in the presence of Garret Reddish, MD.   ***  Return precautions advised.  Pamella Pert

## 2020-12-27 ENCOUNTER — Ambulatory Visit (INDEPENDENT_AMBULATORY_CARE_PROVIDER_SITE_OTHER): Payer: 59 | Admitting: Family Medicine

## 2020-12-27 NOTE — Progress Notes (Signed)
Patient no showed for visit

## 2021-03-29 NOTE — Progress Notes (Signed)
? ?Phone: [phone] ?  ?Subjective:  ?Patient presents today for their annual physical. Chief complaint-noted.  ? ?See problem oriented charting- ?ROS- full  review of systems was completed and negative  ?except for: recent illness- recovering from about 3 weeks ago with congestion, post nasal drip, sinus pressure, sneezing, tinnitus, headaches ?-some lingering cough- some tenderness right chest with cough- especially heavy cough. Offered CXR for cough and right sided ches tpain- he states mild and does not want to pursue x-ray ?-also with constipation, cold intolerance, numbness, muscle aches, joint pain, back pain ? ?The following were reviewed and entered/updated in epic: ?Past Medical History:  ?Diagnosis Date  ? Allergy   ? allergic rhinitis  ? HLD (hyperlipidemia)   ? Nosebleed   ? history of nosebleeds as child  ? Wheezing   ? exercise induced  ? ?Patient Active Problem List  ? Diagnosis Date Noted  ? GAD (generalized anxiety disorder) 10/08/2017  ?  Priority: Medium   ? History of adenomatous polyp of colon 10/03/2016  ?  Priority: Medium   ? Ocular migraine 12/27/2015  ?  Priority: Medium   ? HYPERLIPIDEMIA 11/04/2006  ?  Priority: Medium   ? Atypical chest pain 11/04/2006  ?  Priority: Medium   ? Chronic cough 06/12/2012  ?  Priority: Low  ? Lipoma 06/12/2012  ?  Priority: Low  ? ALLERGIC RHINITIS 11/04/2006  ?  Priority: Low  ? GERD 11/04/2006  ?  Priority: Low  ? Otitis media with effusion 02/02/2017  ? ?Past Surgical History:  ?Procedure Laterality Date  ? KNEE ARTHROSCOPY Right 1990  ?  wrestling injury  ? [place] EXTRACTION  2014  ? ? ?Family History  ?Problem Relation Age of Onset  ? Multiple sclerosis Mother 18  ? Prostate cancer Father   ?     34s or 64s  ? Hypertension Father   ? Thyroid disease Father   ? Multiple sclerosis Maternal Uncle 40  ? Leukemia Maternal Uncle   ? Thyroid disease Paternal Grandmother   ? Colon cancer Neg Hx   ? ? ?Medications- reviewed and updated ?Current  Outpatient Medications  ?Medication Sig Dispense Refill  ? CINNAMON PO Take by mouth.    ? Garlic 740 MG TABS Take by mouth.    ? Multiple Vitamin (MULTIVITAMIN) tablet Take 1 tablet by mouth daily.    ? Omega-3 Fatty Acids (FISH OIL) 1000 MG CAPS Take 1 capsule by mouth daily.    ? saw palmetto 80 MG capsule Take 80 mg by mouth 2 (two) times daily.    ? Turmeric 500 MG CAPS Take by mouth.    ? ?No current facility-administered medications for this visit.  ? ? ?Allergies-reviewed and updated ?Allergies  ?Allergen Reactions  ? Omeprazole Rash  ? ? ?Social History  ? ?Social History Narrative  ? Married--2 sons (see his wife kim as well) Sons 10 and 7 in 2017.   ?   ? Occupation: Radio producer (Forensic psychologist) at [employer]  ? ?Objective  ?Objective:  ?BP 110/80   Pulse 66   Temp 98.5 ?F (36.9 ?C)   Ht 6' (1.829 m)   Wt 218 lb (98.9 kg)   SpO2 97%   BMI 29.57 kg/m?  ?Gen: NAD, resting comfortably ?HEENT: Mucous membranes are moist. Oropharynx normal ?Neck: no thyromegaly ?CV: RRR no murmurs rubs or gallops ?Lungs: CTAB no crackles, wheeze, rhonchi ?Abdomen: soft/nontender/nondistended/normal bowel sounds. No rebound or guarding.  ?Ext: no edema ?Skin: warm, dry ?  Neuro: grossly normal, moves all extremities, PERRLA ?No hernia on exam. Perhaps mild groin strain- some pain with deeper palpation ?  ?Assessment and Plan  ?51 y.o. male presenting for annual physical.  ?Health Maintenance counseling: ?1. Anticipatory guidance: Patient counseled regarding regular dental exams -q6 months, eye exams -yearly with Dr. Valetta Close,  avoiding smoking and second hand smoke , limiting alcohol to 2 beverages per day-perhaps 2-3 drinks every few months- very minimal.  No illicit drugs  ?2. Risk factor reduction:  Advised patient of need for regular exercise and diet rich and fruits and vegetables to reduce risk of heart attack and stroke.  ?Exercise-doing more weights 4 days a week with work gym- only days open- and cutting down  on running to try to help knee (surgery many years ago). Does not like swimming or stationary bike- slightly considering but considers boring.  ?Diet/weight management-in 2019 patient was 182 pounds.  weight up from last CPE in 2021 of 15 lbs. Trying to do protein shakes after he lifts. Wants to bulk up fruits/veggies. q ?Wt Readings from Last 3 Encounters:  ?03/31/21 218 lb (98.9 kg)  ?01/29/20 210 lb (95.3 kg)  ?12/24/19 203 lb 3.2 oz (92.2 kg)  ?3. Immunizations/screenings/ancillary studies ?DISCUSSED:  ?-Shingrix vaccination - defer with recent illness ?-COVID booster vaccination #3- had in December will send dates ?Immunization History  ?Administered Date(s) Administered  ? Influenza Inj Mdck Quad Pf 10/29/2018  ? Influenza Whole 12/12/2009  ? Influenza,inj,Quad PF,6+ Mos 11/05/2012, 10/21/2013, 11/19/2014, 12/27/2015, 12/27/2016, 10/08/2017, 12/22/2020  ? Influenza-Unspecified 09/10/2019  ? MMR 05/28/2009  ? PFIZER(Purple Top)SARS-COV-2 Vaccination 10/14/2019, 11/04/2019, 12/22/2020  ? Td 05/24/2009  ? Tdap 12/24/2019  ?4. Prostate cancer screening-  patient is followed by urology yearly due to father with prostate cancer in 25s or 70s.  PSA had gone up with urology- had MRI done in October that came back clear. Wants PSA as another data point and will follow up with urology.  ?Lab Results  ?Component Value Date  ? PSA 2.74 12/24/2019  ? PSA 3.53 12/17/2018  ? PSA 2.12 12/11/2017  ? 5. Colon cancer screening - 09/24/16 with 5 year repeat planned- due later this year ?6. Skin cancer screening- -saw derm in last year and will see yearly. advised regular sunscreen use. Denies worrisome, changing, or new skin lesions.  ?7. Smoking associated screening (lung cancer screening, AAA screen 65-75, UA)- never smoker ?8. STD screening -  patient opts out as monogamous ? ?Status of chronic or acute concerns  ? ?#hyperlipidemia ?S: Medication: None. 10-year ASCVD risk under 3% typically ?Lab Results  ?Component Value Date   ? CHOL 200 (H) 12/24/2019  ? HDL 47 12/24/2019  ? LDLCALC 134 (H) 12/24/2019  ? LDLDIRECT 148.9 10/24/2012  ? TRIG 88 12/24/2019  ? CHOLHDL 4.3 12/24/2019  ? A/P: update lipids and recalculate risk ? ?#prior situational anxiety ?S: Recommended CBT October 2019. Fortunately patient symptoms improved with decreased travel schedule-he did not end up getting therapy/counseling ?A/P: much better overall  ? ?#Ocular migraines-continues follow-up with [facility] regularly and also sees Dr. Valetta Close locally. Denies change in symptoms or frequency- varies in frequency  ? ?#Chronic cough ?S: Ongoing cough issues since at least 2010. Chest x-ray in 2014 with Dr. Shawna Orleans. Apparently was also tested with PFTs and negative for asthma. ?-slightly worse after recent illness ?-has been told postnasal drip ?-ct chest cardiac morphology without clear trigger ?A/P: slightly worse after recent illness- monitor and he will reach out if worsens  ? ?#  tinnitus both ears - ongoing and stable ? ?#some left groin pain after exercise- does not feel bulge like hernia- no hernia on exam- suspect groin strain  ? ?#Lipoma on right side-been stable but patient would like to consider surgery - refer today ? ?#cardiology- sees for palpitations or chest pain- usually stress related- plans to schedule ? ?Recommended follow up: Return in about 1 year (around 04/01/2022) for physical or sooner if needed.Schedule b4 you leave. ? ?Lab/Order associations: fasting ?  ICD-10-CM   ?1. Preventative health care  Z00.00   ?  ?2. HYPERLIPIDEMIA  E78.2   ?  ? ?No orders of the defined types were placed in this encounter. ? ?I,Jada Bradford,acting as a scribe for Garret Reddish, MD.,have documented all relevant documentation on the behalf of Garret Reddish, MD,as directed by  Garret Reddish, MD while in the presence of Garret Reddish, MD. ? ?I, Garret Reddish, MD, have reviewed all documentation for this visit. The documentation on 03/31/21 for the exam, diagnosis,  procedures, and orders are all accurate and complete. ? ?Return precautions advised.  ?Garret Reddish, MD ? ? ? ?

## 2021-03-31 ENCOUNTER — Ambulatory Visit (INDEPENDENT_AMBULATORY_CARE_PROVIDER_SITE_OTHER): Payer: 59 | Admitting: Family Medicine

## 2021-03-31 ENCOUNTER — Encounter: Payer: Self-pay | Admitting: Family Medicine

## 2021-03-31 VITALS — BP 110/80 | HR 66 | Temp 98.5°F | Ht 72.0 in | Wt 218.0 lb

## 2021-03-31 DIAGNOSIS — Z0000 Encounter for general adult medical examination without abnormal findings: Secondary | ICD-10-CM

## 2021-03-31 DIAGNOSIS — D171 Benign lipomatous neoplasm of skin and subcutaneous tissue of trunk: Secondary | ICD-10-CM

## 2021-03-31 DIAGNOSIS — Z125 Encounter for screening for malignant neoplasm of prostate: Secondary | ICD-10-CM | POA: Diagnosis not present

## 2021-03-31 DIAGNOSIS — F411 Generalized anxiety disorder: Secondary | ICD-10-CM

## 2021-03-31 DIAGNOSIS — E782 Mixed hyperlipidemia: Secondary | ICD-10-CM

## 2021-03-31 LAB — CBC WITH DIFFERENTIAL/PLATELET
Basophils Absolute: 0 10*3/uL (ref 0.0–0.1)
Basophils Relative: 0.5 % (ref 0.0–3.0)
Eosinophils Absolute: 0 10*3/uL (ref 0.0–0.7)
Eosinophils Relative: 0.6 % (ref 0.0–5.0)
HCT: 42 % (ref 39.0–52.0)
Hemoglobin: 14.3 g/dL (ref 13.0–17.0)
Lymphocytes Relative: 29.6 % (ref 12.0–46.0)
Lymphs Abs: 1.2 10*3/uL (ref 0.7–4.0)
MCHC: 34 g/dL (ref 30.0–36.0)
MCV: 88.5 fl (ref 78.0–100.0)
Monocytes Absolute: 0.3 10*3/uL (ref 0.1–1.0)
Monocytes Relative: 6.6 % (ref 3.0–12.0)
Neutro Abs: 2.6 10*3/uL (ref 1.4–7.7)
Neutrophils Relative %: 62.7 % (ref 43.0–77.0)
Platelets: 253 10*3/uL (ref 150.0–400.0)
RBC: 4.74 Mil/uL (ref 4.22–5.81)
RDW: 13.3 % (ref 11.5–15.5)
WBC: 4.2 10*3/uL (ref 4.0–10.5)

## 2021-03-31 LAB — COMPREHENSIVE METABOLIC PANEL
ALT: 29 U/L (ref 0–53)
AST: 28 U/L (ref 0–37)
Albumin: 4.5 g/dL (ref 3.5–5.2)
Alkaline Phosphatase: 64 U/L (ref 39–117)
BUN: 17 mg/dL (ref 6–23)
CO2: 27 mEq/L (ref 19–32)
Calcium: 9 mg/dL (ref 8.4–10.5)
Chloride: 107 mEq/L (ref 96–112)
Creatinine, Ser: 0.92 mg/dL (ref 0.40–1.50)
GFR: 96.67 mL/min (ref >60.00)
Glucose, Bld: 96 mg/dL (ref 70–99)
Potassium: 4.2 mEq/L (ref 3.5–5.1)
Sodium: 139 mEq/L (ref 135–145)
Total Bilirubin: 0.6 mg/dL (ref 0.2–1.2)
Total Protein: 6.9 g/dL (ref 6.0–8.3)

## 2021-03-31 LAB — LIPID PANEL
Cholesterol: 209 mg/dL — ABNORMAL HIGH (ref 0–200)
HDL: 38.9 mg/dL — ABNORMAL LOW (ref >39.00)
LDL Cholesterol: 152 mg/dL — ABNORMAL HIGH (ref 0–99)
NonHDL: 170.03
Total CHOL/HDL Ratio: 5
Triglycerides: 89 mg/dL (ref 0.0–149.0)
VLDL: 17.8 mg/dL (ref 0.0–40.0)

## 2021-03-31 NOTE — Patient Instructions (Addendum)
Log covid shot 12/22/20- he reports had same day as flu shot whih is listed on that date ? ?Can do shingrix shot later - when you feel better- can call for nurse visit ? ?We will call you within two weeks about your referral to surgery. If you do not hear within 2 weeks, give [place] a call.  ?You can also call [facility] [place] [facility] directly ?Phone: [phone] ? ?Please stop by lab before you go ?If you have mychart- we will send your results within 3 business days of [place] receiving them.  ?If you do not have mychart- we will call you about results within 5 business days of [place] receiving them.  ?*please also note that you will see labs on mychart as soon as they post. I will later go in and write notes on them- will say "notes from Dr. Yong Channel"  ? ?Recommended follow up: Return in about 1 year (around 04/01/2022) for physical or sooner if needed.Schedule b4 you leave. ?

## 2021-04-04 LAB — TSH: TSH: 1.74 u[IU]/mL (ref 0.35–5.50)

## 2021-04-04 LAB — PSA: PSA: 4.06 ng/mL — ABNORMAL HIGH (ref 0.10–4.00)

## 2021-04-05 ENCOUNTER — Other Ambulatory Visit: Payer: Self-pay

## 2021-04-05 DIAGNOSIS — Z8042 Family history of malignant neoplasm of prostate: Secondary | ICD-10-CM

## 2021-04-05 DIAGNOSIS — R972 Elevated prostate specific antigen [PSA]: Secondary | ICD-10-CM

## 2021-04-28 ENCOUNTER — Ambulatory Visit (HOSPITAL_BASED_OUTPATIENT_CLINIC_OR_DEPARTMENT_OTHER): Payer: 59 | Admitting: Cardiology

## 2021-05-11 ENCOUNTER — Ambulatory Visit (HOSPITAL_BASED_OUTPATIENT_CLINIC_OR_DEPARTMENT_OTHER): Payer: 59 | Admitting: Cardiology

## 2021-06-16 ENCOUNTER — Ambulatory Visit (INDEPENDENT_AMBULATORY_CARE_PROVIDER_SITE_OTHER): Payer: 59 | Admitting: Cardiology

## 2021-06-16 ENCOUNTER — Encounter (HOSPITAL_BASED_OUTPATIENT_CLINIC_OR_DEPARTMENT_OTHER): Payer: Self-pay | Admitting: Cardiology

## 2021-06-16 VITALS — BP 126/78 | HR 64 | Ht 72.0 in | Wt 220.4 lb

## 2021-06-16 DIAGNOSIS — Z7182 Exercise counseling: Secondary | ICD-10-CM | POA: Diagnosis not present

## 2021-06-16 DIAGNOSIS — Z713 Dietary counseling and surveillance: Secondary | ICD-10-CM | POA: Diagnosis not present

## 2021-06-16 DIAGNOSIS — Z7189 Other specified counseling: Secondary | ICD-10-CM

## 2021-06-16 DIAGNOSIS — E7800 Pure hypercholesterolemia, unspecified: Secondary | ICD-10-CM | POA: Diagnosis not present

## 2021-06-16 DIAGNOSIS — Z8249 Family history of ischemic heart disease and other diseases of the circulatory system: Secondary | ICD-10-CM | POA: Diagnosis not present

## 2021-06-16 NOTE — Patient Instructions (Addendum)
Medication Instructions:  Your Physician recommend you continue on your current medication as directed.    *If you need a refill on your cardiac medications before your next appointment, please call your pharmacy*   Lab Work: None ordered today!   Testing/Procedures: EKG looks good today!    Follow-Up: At [facility], you and your health needs are our priority.  As part of our continuing mission to provide you with exceptional heart care, we have created designated Provider Care Teams.  These Care Teams include your primary Cardiologist (physician) and Advanced Practice Providers (APPs -  Physician Assistants and Nurse Practitioners) who all work together to provide you with the care you need, when you need it.  We recommend signing up for the patient portal called "MyChart".  Sign up information is provided on this After Visit Summary.  MyChart is used to connect with patients for Virtual Visits (Telemedicine).  Patients are able to view lab/test results, encounter notes, upcoming appointments, etc.  Non-urgent messages can be sent to your provider as well.   To learn more about what you can do with MyChart, go to [email or web address].    Your next appointment:   1 year(s)  The format for your next appointment:   In Person  Provider:   Buford Dresser, MD{  Other Instructions Recent data, summarized from [place] from a study from the [facility]:  https://wilkins.info/  "Researchers at the [facility] set out to answer this question by comparing statins to supplements in a clinical trial. They tracked the outcomes of 190 adults, ages 12 to 101. Some participants were given a 5 mg daily dose of rosuvastatin, a statin that is sold under the brand name Crestor for 28 days. Others were given supplements, including fish oil, cinnamon, garlic,  turmeric, plant sterols or red yeast rice for the same period."  "What we found was that rosuvastatin lowered LDL cholesterol by almost 38% and that was vastly superior to placebo and any of the six supplements studied in the trial," study Despina Hidden, M.D. of the [facility], [place] told NPR. He says this level of reduction is enough to lower the risk of heart attacks and strokes. The findings are published in the Journal of the [employer] of Cardiology.  "Oftentimes these supplements are marketed as 'natural ways' to lower your cholesterol," says Laffin. But he says none of the dietary supplements demonstrated any significant decrease in LDL cholesterol compared with a placebo. LDL cholesterol is considered the 'bad cholesterol' because it can contribute to plaque build-up in the artery walls - which can narrow the arteries, and set the stage for heart attacks and strokes"   Important Information About Sugar     '

## 2021-06-16 NOTE — Progress Notes (Signed)
Cardiology Office Note:    Date:  06/16/2021   ID:  Stephen Bridges, DOB 1970/08/10, MRN [id number]  PCP:  Stephen Olp, MD  Cardiologist:  Stephen Dresser, MD PhD  Referring MD: Stephen Olp, MD   CC: follow up  History of Present Illness:    Stephen Bridges is a 51 y.o. male with a hx of family history of CV disease, hyperlipidemia, anxiety who is seen for follow up. I initially saw him 11/22/2017 as a new consult at the request of Stephen Olp, MD for the evaluation and management of chest pain.  Cardiac history: several years of sharp/tense left sided chest pain with occasional radiation to neck and arms. Worse with stress.  -cardiac CTA 01/14/18 normal, no calcium -high cholesterol, never been on medications. Highest is LDL 177, lowest 143. Totals over 200, highest 240.  -POET normal 02/06/2017,  -Family history: high cholesterol in father, brother, maternal aunts. Stephen Bridges with aneurysm, Stephen Bridges with stroke. Stephen Bridges with emphysema, Stephen Bridges with liver failure. Mom has multiple sclerosis or similar. No early onset CAD, no sudden cardiac death.  Last visit: Patient endorsed fatigue, intermittent chest pain, dizziness. Patient had gained weight, but was lifting and running as well. Patient endorsed a poor diet.   Today:  The patient reports gaining weight. He stopped running last year due to 3 ankle stress fractures, which may be why he has gained weight. He wants to find a way to do cardio again. He does work out 3 days a week trying to use the elliptical, but he usually lifts weights. He got back into lifting about a year ago after recovering from his injuries.  The patient has been suffering intermittent chest pains from stress. He has constant coldness in his hands and feet, as well as tingling in his fingers. He believes it is caused by Raynaud's.  His blood pressure was higher than usual today, which was measured at 126/78. He says it usually is at 120s. He attributes  this to the appointment and to drinking coffee.   His diet has been poor recently. His LDL was 152 at his last appointment. He frequently eats nuts and potato chips. He typically does not eat much in the mornings, aside from drinking a cup of coffee. He has a lot of caffeine throughout the day.   His maternal aunts have high cholesterol.    Patient reports an enlarged prostate, but is is benign for now. It is being followed by a urologist.  He denies any  shortness of breath, or peripheral edema. No lightheadedness, headaches, syncope, orthopnea, or PND.    Past Medical History:  Diagnosis Date   Allergy    allergic rhinitis   HLD (hyperlipidemia)    Nosebleed    history of nosebleeds as child   Wheezing    exercise induced    Past Surgical History:  Procedure Laterality Date   KNEE ARTHROSCOPY Right 1990    wrestling injury   WISDOM TOOTH EXTRACTION  2014    Current Medications: Current Outpatient Medications on File Prior to Visit  Medication Sig   CINNAMON PO Take by mouth.   Garlic 785 MG TABS Take by mouth.   Multiple Vitamin (MULTIVITAMIN) tablet Take 1 tablet by mouth daily.   Omega-3 Fatty Acids (FISH OIL) 1000 MG CAPS Take 1 capsule by mouth daily.   saw palmetto 80 MG capsule Take 80 mg by mouth 2 (two) times daily.   Turmeric 500  MG CAPS Take by mouth.   No current facility-administered medications on file prior to visit.     Allergies:   Omeprazole   Social History   Tobacco Use   Smoking status: Never   Smokeless tobacco: Never  Substance Use Topics   Alcohol use: Yes    Comment: occasional   Drug use: No    Family History: The patient's family history includes Hypertension in his father; Leukemia in his maternal uncle; Multiple sclerosis (age of onset: 28) in his mother; Multiple sclerosis (age of onset: 42) in his maternal uncle; Prostate cancer in his father; Thyroid disease in his father and paternal grandmother. There is no history of Colon  cancer. high cholesterol in father, brother, maternal aunts. Stephen Bridges with aneurysm, Stephen Bridges with stroke. Stephen Bridges with emphysema, Stephen Bridges with liver failure. Mom has multiple sclerosis or similar. No early onset, no sudden cardiac death.  ROS:   Please see the history of present illness.    (+) Chest pain (+) Stress (+)Coldness in hands and feet (+)Tingling in fingers  Additional ROS negative except as documented in HPI.    EKGs/Labs/Other Studies Reviewed:    The following studies were reviewed today:  CT cardiac 01/13/18 1. Coronary artery calcium score 0 Agatston units, suggesting low risk for future cardiac events.  2.  No significant coronary disease noted.  POET 02/06/17 Blood pressure demonstrated a normal response to exercise. No T wave inversion was noted during stress. Upsloping ST segment depression ST segment depression was noted during stress in the V4, V6, V5, II, III and aVF leads, and returning to baseline after less than 1 minute of recovery. Negative, adequate stress test. Exercise Time  Exercise duration (min) 11 min    Exercise duration (sec) 0 sec    Peak Stress Vitals  Peak HR 179 bpm    Peak BP 175/81 mmHg    Exercise Data  MPHR 174 bpm    Percent HR 102 %    RPE 16     Estimated workload 13.4 METS      EKG:  EKG is personally reviewed.   06/16/21: SR with SA, 64 bpm 01/29/20: sinus rhythm, HR 65 bpm  Recent Labs: 03/31/2021: ALT 29; BUN 17; Creatinine, Ser 0.92; Hemoglobin 14.3; Platelets 253.0; Potassium 4.2; Sodium 139; TSH 1.74  Recent Lipid Panel    Component Value Date/Time   CHOL 209 (H) 03/31/2021 1224   CHOL 187 01/10/2018 1156   TRIG 89.0 03/31/2021 1224   HDL 38.90 (L) 03/31/2021 1224   HDL 55 01/10/2018 1156   CHOLHDL 5 03/31/2021 1224   VLDL 17.8 03/31/2021 1224   LDLCALC 152 (H) 03/31/2021 1224   LDLCALC 134 (H) 12/24/2019 0852   LDLDIRECT 148.9 10/24/2012 0943    Physical Exam:    VS:  BP 126/78   Pulse 64   Ht 6'  (1.829 m)   Wt 220 lb 6.4 oz (100 kg)   SpO2 98%   BMI 29.89 kg/m     Wt Readings from Last 3 Encounters:  06/16/21 220 lb 6.4 oz (100 kg)  03/31/21 218 lb (98.9 kg)  01/29/20 210 lb (95.3 kg)    GEN: Well nourished, well developed in no acute distress HEENT: Normal, moist mucous membranes NECK: No JVD CARDIAC: regular rhythm, normal S1 and S2, no rubs or gallops. No murmur. VASCULAR: Radial and DP pulses 2+ bilaterally. No carotid bruits RESPIRATORY:  Clear to auscultation without rales, wheezing or rhonchi  ABDOMEN: Soft,  non-tender, non-distended MUSCULOSKELETAL:  Ambulates independently SKIN: Warm and dry, no edema NEUROLOGIC:  Alert and oriented x 3. No focal neuro deficits noted. PSYCHIATRIC:  Normal affect   ASSESSMENT:    1. Pure hypercholesterolemia   2. Family history of heart disease   3. Nutritional counseling   4. Exercise counseling   5. Cardiac risk counseling   6. Counseling on health promotion and disease prevention     PLAN:    Hypercholesterolemia:  -strong family history, which is likely his cause of elevated LDL -discussed diet recommendations at length. Is active but more weight lifting than cardiovascular currently -calcium score of 0 very helpful in risk stratification (low risk) -lp(a) normal 2020 -hsCRP normal 2020 -no indication for statin given calcium score of 0 -lipids from 03/2021 reviewed -we discussed the recent [facility] study regarding supplements. Reviewed that this showed no benefit to OTC supplements listed for CV risk and a signal for potential harm with plant sterols. He will stop the plant sterol supplement  Primary prevention: CV risk counseling and prevention counseling, with family history of heart disease -recommend heart healthy/Mediterranean diet, with whole grains, fruits, vegetable, fish, lean meats, nuts, and olive oil. Limit salt. -recommend moderate walking, 3-5 times/week for 30-50 minutes each session. Aim for  at least 150 minutes.week. Goal should be pace of 3 miles/hours, or walking 1.5 miles in 30 minutes -recommend avoidance of tobacco products. Avoid excess alcohol. -ASCVD risk score: The 10-year ASCVD risk score (Arnett DK, et al., 2019) is: 5.1%   Values used to calculate the score:     Age: 33 years     Sex: Male     Is Non-Hispanic African American: No     Diabetic: No     Tobacco smoker: No     Systolic Blood Pressure: 326 mmHg     Is BP treated: No     HDL Cholesterol: 38.9 mg/dL     Total Cholesterol: 209 mg/dL   Plan for follow up: 1 year per patient preference  Medication Adjustments/Labs and Tests Ordered: Current medicines are reviewed at length with the patient today.  Concerns regarding medicines are outlined above.  Orders Placed This Encounter  Procedures   EKG 12-Lead   No orders of the defined types were placed in this encounter.   Patient Instructions  Medication Instructions:  Your Physician recommend you continue on your current medication as directed.    *If you need a refill on your cardiac medications before your next appointment, please call your pharmacy*   Lab Work: None ordered today!   Testing/Procedures: EKG looks good today!    Follow-Up: At [facility], you and your health needs are our priority.  As part of our continuing mission to provide you with exceptional heart care, we have created designated Provider Care Teams.  These Care Teams include your primary Cardiologist (physician) and Advanced Practice Providers (APPs -  Physician Assistants and Nurse Practitioners) who all work together to provide you with the care you need, when you need it.  We recommend signing up for the patient portal called "MyChart".  Sign up information is provided on this After Visit Summary.  MyChart is used to connect with patients for Virtual Visits (Telemedicine).  Patients are able to view lab/test results, encounter notes, upcoming appointments, etc.   Non-urgent messages can be sent to your provider as well.   To learn more about what you can do with MyChart, go to [email or web address].    Your next  appointment:   1 year(s)  The format for your next appointment:   In Person  Provider:   Buford Dresser, MD{  Other Instructions Recent data, summarized from [place] from a study from the [facility]:  https://wilkins.info/  "Researchers at the [facility] set out to answer this question by comparing statins to supplements in a clinical trial. They tracked the outcomes of 190 adults, ages 28 to 37. Some participants were given a 5 mg daily dose of rosuvastatin, a statin that is sold under the brand name Crestor for 28 days. Others were given supplements, including fish oil, cinnamon, garlic, turmeric, plant sterols or red yeast rice for the same period."  "What we found was that rosuvastatin lowered LDL cholesterol by almost 38% and that was vastly superior to placebo and any of the six supplements studied in the trial," study Despina Hidden, M.D. of the [facility], [place] told NPR. He says this level of reduction is enough to lower the risk of heart attacks and strokes. The findings are published in the Journal of the [employer] of Cardiology.  "Oftentimes these supplements are marketed as 'natural ways' to lower your cholesterol," says Laffin. But he says none of the dietary supplements demonstrated any significant decrease in LDL cholesterol compared with a placebo. LDL cholesterol is considered the 'bad cholesterol' because it can contribute to plaque build-up in the artery walls - which can narrow the arteries, and set the stage for heart attacks and strokes"   Important Information About Sugar     '    I,Mathew Stumpf,acting as a Education administrator for  [employer], MD.,have documented all relevant documentation on the behalf of Stephen Dresser, MD,as directed by  Stephen Dresser, MD while in the presence of Stephen Dresser, MD.   I, Stephen Dresser, MD, have reviewed all documentation for this visit. The documentation on 06/16/21 for the exam, diagnosis, procedures, and orders are all accurate and complete.   Signed, Stephen Dresser, MD PhD 06/16/2021 11:16 AM    [place]

## 2021-07-31 ENCOUNTER — Encounter: Payer: Self-pay | Admitting: Gastroenterology

## 2021-10-02 ENCOUNTER — Encounter: Payer: Self-pay | Admitting: *Deleted

## 2021-12-21 ENCOUNTER — Encounter: Payer: Self-pay | Admitting: *Deleted

## 2022-04-06 ENCOUNTER — Encounter: Payer: 59 | Admitting: Family Medicine

## 2022-06-22 IMAGING — MR MR PROSTATE WO/W CM
12 series · 48 of 48 positions shown · IV contrast (multihance)
Comparison: None.

CLINICAL DATA: Elevated PSA level of 3.56.

EXAM:
MR PROSTATE WITHOUT AND WITH CONTRAST
TECHNIQUE: Multiplanar multisequence MRI images were obtained of the pelvis
centered about the prostate. Pre and post contrast images were
obtained.
CONTRAST:  20mL MULTIHANCE GADOBENATE DIMEGLUMINE 529 MG/ML IV SOLN

[Series 3: T2 · coronal · 3.0mm · 0.56mm/px · 1 of 23 slices shown (1 of 3)]
[im 1/23]
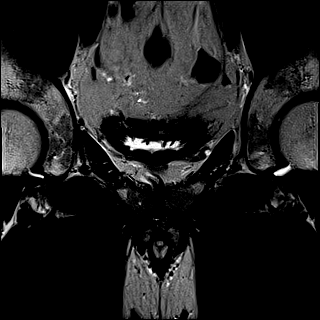

[Series 4: T1 · axial · 5.0mm · 1.16mm/px · z∈[-64,+151]mm · 2 of 88 slices shown]
[im 1/88]
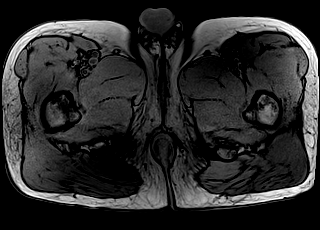
[im 88/88]
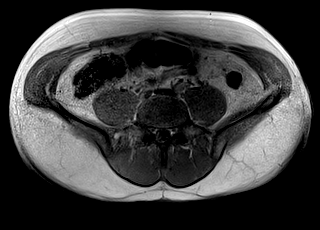

[Series 5: DWI · axial · 3.0mm · 1.75mm/px · 1 of 60 slices shown (1 of 3)]
[im 1/60]
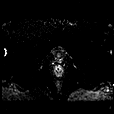

[Series 6: DWI · axial · 3.0mm · 1.75mm/px · 1 of 20 slices shown (2 of 3)]
[im 1/20]
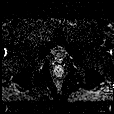

[Series 7: DWI · axial · 3.0mm · 1.75mm/px · 1 of 20 slices shown (3 of 3)]
[im 1/20]
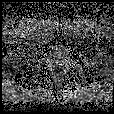

[Series 8: T2 · axial · 3.0mm · 0.56mm/px · 1 of 23 slices shown (2 of 3)]
[im 1/23]
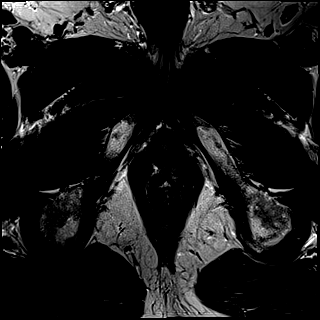

[Series 9: T2 · axial · 1.0mm · 1.04mm/px · z∈[-39,+32]mm · 2 of 72 slices shown (3 of 3)]
[im 1/72]
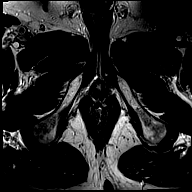
[im 72/72]
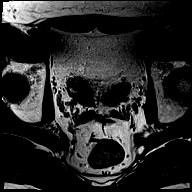

[Series 16: pre t1_twist_tra_dyn · axial · non-contrast · 3.5mm · 0.83mm/px · 1 of 20 slices shown]
[im 1/20]
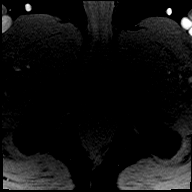

[Series 17: post t1_twist_tra_dyn-copy center · axial · non-contrast · 3.5mm · 0.83mm/px · z∈[-50,+17]mm · 17 of 600 slices shown]
[im 1/600]
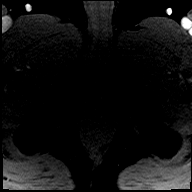
[im 38/600]
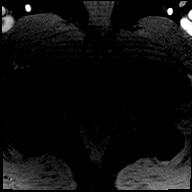
[im 75/600]
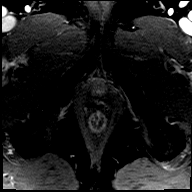
[im 113/600]
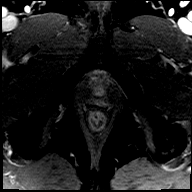
[im 150/600]
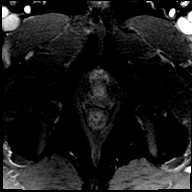
[im 188/600]
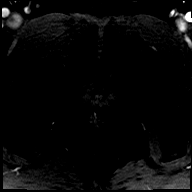
[im 225/600]
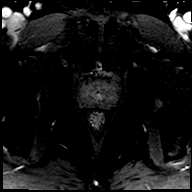
[im 263/600]
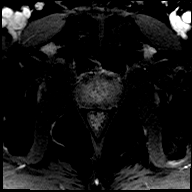
[im 300/600]
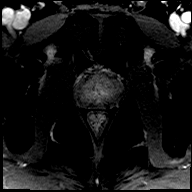
[im 337/600]
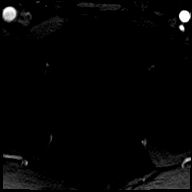
[im 375/600]
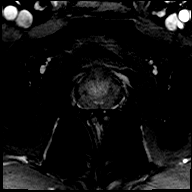
[im 412/600]
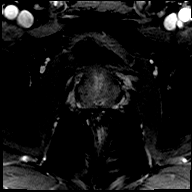
[im 450/600]
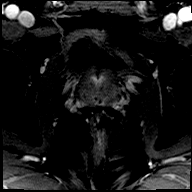
[im 487/600]
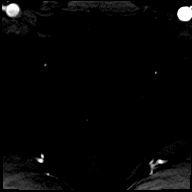
[im 525/600]
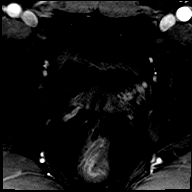
[im 562/600]
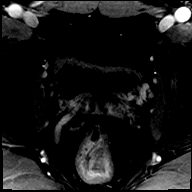
[im 600/600]
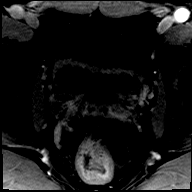

[Series 18: post t1_twist_tra_dyn-copy cent_sub · axial · 3.5mm · 0.83mm/px · z∈[-50,+17]mm · 17 of 580 slices shown]
[im 1/580]
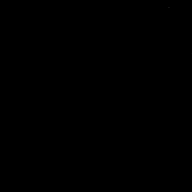
[im 37/580]
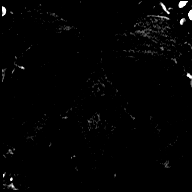
[im 73/580]
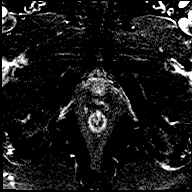
[im 109/580]
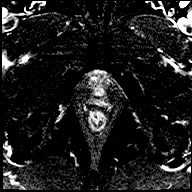
[im 145/580]
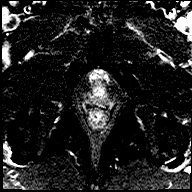
[im 181/580]
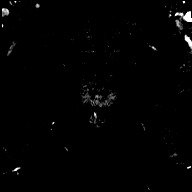
[im 218/580]
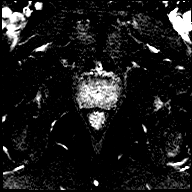
[im 254/580]
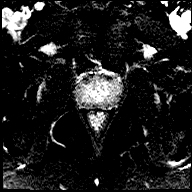
[im 290/580]
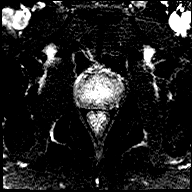
[im 326/580]
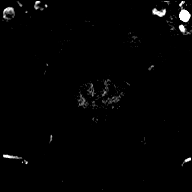
[im 362/580]
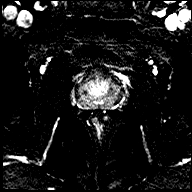
[im 399/580]
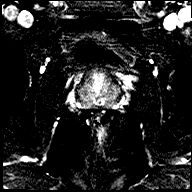
[im 435/580]
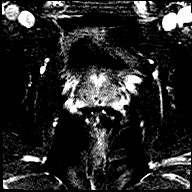
[im 471/580]
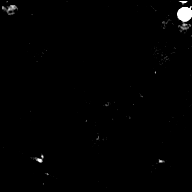
[im 507/580]
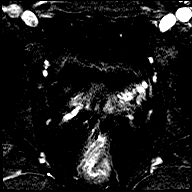
[im 543/580]
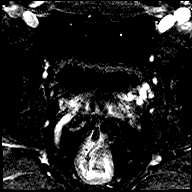
[im 580/580]
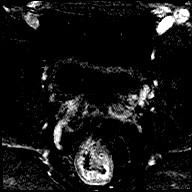

[Series 19: t1_vibe_dixon_tra_f · axial · 2.5mm · 0.91mm/px · z∈[-55,+143]mm · 2 of 80 slices shown]
[im 1/80]
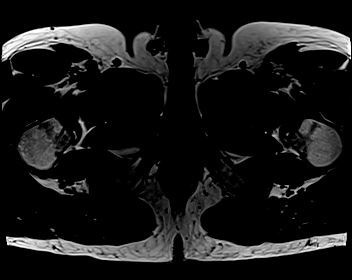
[im 80/80]
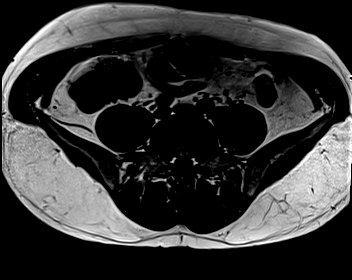

[Series 20: t1_vibe_dixon_tra_w · axial · 2.5mm · 0.91mm/px · z∈[-55,+143]mm · 2 of 80 slices shown]
[im 1/80]
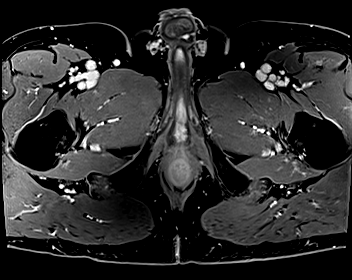
[im 80/80]
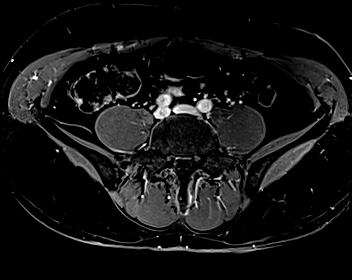

[48 of 48 positions shown; findings below may reference images not displayed]

FINDINGS: Prostate:

Hazy low T2 signal in the peripheral zone of the prostate gland
bilaterally is diffuse and nonfocal, likely postinflammatory,
considered PI-RADS category 2.

No focal early enhancement in the peripheral zone is identified. No
substantial restriction of diffusion. No specific lesion of
intermediate or higher suspicion for prostate cancer is identified.

Volume: 3D volumetric analysis: Prostate volume 33.28 cc (4.3 by
by 4.4 cm).

Transcapsular spread:  Absent

Seminal vesicle involvement: Absent

Neurovascular bundle involvement: Absent

Pelvic adenopathy: Absent

Bone metastasis: Absent

Other findings: Small degenerative subcortical cysts anteriorly
along the left femoral head. A component of the appearance could
represent synovial herniation.

Trace free pelvic fluid is suggested on image 22 series 3, etiology
uncertain.
IMPRESSION: 1. No findings of intermediate or higher suspicion for prostate
cancer identified. No significant prostatomegaly.
2. Hazy low T2 signal in the peripheral zone of the prostate gland
diffusely is nonfocal and likely postinflammatory, considered
PI-RADS category 2.
3. Small degenerative subcortical cysts and/or synovial herniation
pit along the left femoral head anteriorly.
4. Trace free pelvic fluid, etiology uncertain.

## 2022-08-23 ENCOUNTER — Encounter (HOSPITAL_BASED_OUTPATIENT_CLINIC_OR_DEPARTMENT_OTHER): Payer: Self-pay | Admitting: Cardiology

## 2022-08-23 ENCOUNTER — Encounter (INDEPENDENT_AMBULATORY_CARE_PROVIDER_SITE_OTHER): Payer: Self-pay

## 2022-08-23 ENCOUNTER — Ambulatory Visit (INDEPENDENT_AMBULATORY_CARE_PROVIDER_SITE_OTHER): Payer: 59 | Admitting: Cardiology

## 2022-08-23 VITALS — BP 112/88 | HR 64 | Ht 72.0 in | Wt 224.0 lb

## 2022-08-23 DIAGNOSIS — E7800 Pure hypercholesterolemia, unspecified: Secondary | ICD-10-CM | POA: Diagnosis not present

## 2022-08-23 DIAGNOSIS — Z8249 Family history of ischemic heart disease and other diseases of the circulatory system: Secondary | ICD-10-CM | POA: Diagnosis not present

## 2022-08-23 DIAGNOSIS — Z7189 Other specified counseling: Secondary | ICD-10-CM | POA: Diagnosis not present

## 2022-08-23 NOTE — Progress Notes (Signed)
Cardiology Office Note:  .    Date:  08/23/2022  ID:  Stephen Bridges, DOB Sep 30, 1970, MRN [id number] PCP: Stephen Majestic, MD  [facility] Providers Cardiologist:  Stephen Red, MD     History of Present Illness: Stephen Bridges is a 52 y.o. male with a hx of family history of CV disease, hyperlipidemia, anxiety, who is seen for follow up. I initially saw him 11/22/2017 as a new consult at the request of Stephen Majestic, MD for the evaluation and management of chest pain.   Cardiac history: several years of sharp/tense left sided chest pain with occasional radiation to neck and arms. Worse with stress.  -cardiac CTA 01/14/18 normal, no calcium -high cholesterol, never been on medications. Highest is LDL 177, lowest 143. Totals over 200, highest 240.  -POET normal 02/06/2017,  -Family history: high cholesterol in father, brother, maternal aunts. Stephen Bridges with aneurysm, Stephen Bridges with stroke. Stephen Bridges with emphysema, Stephen Bridges with liver failure. Mom has multiple sclerosis or similar. No early onset CAD, no sudden cardiac death.   At his visit Jul 04, 2021, he reported weight gain in the setting of not being able to run due to 3 ankle stress fractures. He also complained of intermittent chest pains from stress. He noted constant coldness in his hands and feet, as well as tingling in his fingers which he felt was caused by Raynaud's. His blood pressure was higher than usual in the office at 126/78. He reported home readings usually closer to 120 systolic. He admitted to having a poor diet. His LDL was 152 at his prior appointment. Had a lot of caffeine throughout the day. In his family, his maternal aunts have hyperlipidemia. Additionally he had an enlarged prostate, benign at that time and followed by Bridges.    Today, he reports that he was diagnosed with early stage prostate cancer in January, currently being monitored by Stephen Bridges. His PSA was elevated and the diagnosis was  confirmed with biopsy. His father also had prostate cancer s/p prostatectomy.   Continues to work on weight loss. Exercises at least 2-3 days a week. Sometimes struggles with eating late at night. He is scheduled for his annual physical in 2 weeks at which time he will have lipid panel.  He denies any palpitations, chest pain, shortness of breath, peripheral edema, lightheadedness, headaches, syncope, orthopnea, or PND.  ROS:  Please see the history of present illness. ROS otherwise negative except as noted.    Studies Reviewed: Marland Kitchen    EKG Interpretation Date/Time:  Thursday August 23 2022 16:23:43 EDT Ventricular Rate:  64 PR Interval:  168 QRS Duration:  106 QT Interval:  392 QTC Calculation: 404 R Axis:   26  Text Interpretation: Normal sinus rhythm Normal ECG No previous ECGs available Confirmed by Stephen Bridges [phone]) on 08/23/2022 4:25:55 PM    Physical Exam:    VS:  BP 112/88   Pulse 64   Ht 6' (1.829 m)   Wt 224 lb (101.6 kg)   SpO2 96%   BMI 30.38 kg/m    Wt Readings from Last 3 Encounters:  08/23/22 224 lb (101.6 kg)  06/16/21 220 lb 6.4 oz (100 kg)  03/31/21 218 lb (98.9 kg)    GEN: Well nourished, well developed in no acute distress HEENT: Normal, moist mucous membranes NECK: No JVD CARDIAC: regular rhythm, normal S1 and S2, no rubs or gallops. No murmur. VASCULAR: Radial and DP pulses 2+ bilaterally.  No carotid bruits RESPIRATORY:  Clear to auscultation without rales, wheezing or rhonchi  ABDOMEN: Soft, non-tender, non-distended MUSCULOSKELETAL:  Ambulates independently SKIN: Warm and dry, no edema NEUROLOGIC:  Alert and oriented x 3. No focal neuro deficits noted. PSYCHIATRIC:  Normal affect   ASSESSMENT AND PLAN: .    Hypercholesterolemia:  -strong family history, which is likely his cause of elevated LDL -discussed diet recommendations at length. Is active but more weight lifting than cardiovascular currently -calcium score of 0 very helpful  in risk stratification (low risk), no significant plaque on CCTA in 2020 -lp(a) normal 2020 -hsCRP normal 2020 -discussed at length today. By guidelines, no indication for statin at this time. He has been less strict with his diet in the setting of his prostate cancer diagnosis, and we anticipate that his upcoming lipids may be worse than prior. Offered statin if he ever preferred it, but no guideline indication at this time. Consider repeat calcium score in the future.  Primary prevention: CV risk counseling and prevention counseling, with family history of heart disease -recommend heart healthy/Mediterranean diet, with whole grains, fruits, vegetable, fish, lean meats, nuts, and olive oil. Limit salt. -recommend moderate walking, 3-5 times/week for 30-50 minutes each session. Aim for at least 150 minutes.week. Goal should be pace of 3 miles/hours, or walking 1.5 miles in 30 minutes -recommend avoidance of tobacco products. Avoid excess alcohol. -ASCVD risk score: The 10-year ASCVD risk score (Arnett DK, et al., 2019) is: 4.5%   Values used to calculate the score:     Age: 67 years     Sex: Male     Is Non-Hispanic African American: No     Diabetic: No     Tobacco smoker: No     Systolic Blood Pressure: 112 mmHg     Is BP treated: No     HDL Cholesterol: 38.9 mg/dL     Total Cholesterol: 209 mg/dL   Dispo: Follow-up in 1 year, or sooner as needed.  I,Mathew Stumpf,acting as a Neurosurgeon for [employer], MD.,have documented all relevant documentation on the behalf of Stephen Red, MD,as directed by  Stephen Red, MD while in the presence of Stephen Red, MD.  I, Stephen Red, MD, have reviewed all documentation for this visit. The documentation on 08/23/22 for the exam, diagnosis, procedures, and orders are all accurate and complete.   Signed, Stephen Red, MD

## 2022-08-23 NOTE — Patient Instructions (Signed)
Medication Instructions:  Continue current medications  *If you need a refill on your cardiac medications before your next appointment, please call your pharmacy*   Lab Work: None Ordered   Testing/Procedures: None Ordered   Follow-Up: At [facility], you and your health needs are our priority.  As part of our continuing mission to provide you with exceptional heart care, we have created designated Provider Care Teams.  These Care Teams include your primary Cardiologist (physician) and Advanced Practice Providers (APPs -  Physician Assistants and Nurse Practitioners) who all work together to provide you with the care you need, when you need it.  We recommend signing up for the patient portal called "MyChart".  Sign up information is provided on this After Visit Summary.  MyChart is used to connect with patients for Virtual Visits (Telemedicine).  Patients are able to view lab/test results, encounter notes, upcoming appointments, etc.  Non-urgent messages can be sent to your provider as well.   To learn more about what you can do with MyChart, go to [email or web address].    Your next appointment:   1 year(s)  Provider:   Bridgette Christopher, MD    Other Instructions   

## 2022-09-18 ENCOUNTER — Ambulatory Visit (INDEPENDENT_AMBULATORY_CARE_PROVIDER_SITE_OTHER): Payer: 59 | Admitting: Family Medicine

## 2022-09-18 ENCOUNTER — Encounter: Payer: Self-pay | Admitting: Family Medicine

## 2022-09-18 VITALS — BP 124/74 | HR 70 | Temp 98.8°F | Ht 72.0 in | Wt 221.8 lb

## 2022-09-18 DIAGNOSIS — D171 Benign lipomatous neoplasm of skin and subcutaneous tissue of trunk: Secondary | ICD-10-CM

## 2022-09-18 DIAGNOSIS — F411 Generalized anxiety disorder: Secondary | ICD-10-CM | POA: Diagnosis not present

## 2022-09-18 DIAGNOSIS — Z0000 Encounter for general adult medical examination without abnormal findings: Secondary | ICD-10-CM | POA: Diagnosis not present

## 2022-09-18 DIAGNOSIS — Z23 Encounter for immunization: Secondary | ICD-10-CM | POA: Diagnosis not present

## 2022-09-18 DIAGNOSIS — C61 Malignant neoplasm of prostate: Secondary | ICD-10-CM | POA: Insufficient documentation

## 2022-09-18 DIAGNOSIS — E782 Mixed hyperlipidemia: Secondary | ICD-10-CM

## 2022-09-18 LAB — CBC WITH DIFFERENTIAL/PLATELET
Basophils Absolute: 0 10*3/uL (ref 0.0–0.1)
Basophils Relative: 0.5 % (ref 0.0–3.0)
Eosinophils Absolute: 0.1 10*3/uL (ref 0.0–0.7)
Eosinophils Relative: 1.7 % (ref 0.0–5.0)
HCT: 44.9 % (ref 39.0–52.0)
Hemoglobin: 14.8 g/dL (ref 13.0–17.0)
Lymphocytes Relative: 26.8 % (ref 12.0–46.0)
Lymphs Abs: 1.5 10*3/uL (ref 0.7–4.0)
MCHC: 33.1 g/dL (ref 30.0–36.0)
MCV: 89.5 fl (ref 78.0–100.0)
Monocytes Absolute: 0.4 10*3/uL (ref 0.1–1.0)
Monocytes Relative: 7.3 % (ref 3.0–12.0)
Neutro Abs: 3.6 10*3/uL (ref 1.4–7.7)
Neutrophils Relative %: 63.7 % (ref 43.0–77.0)
Platelets: 262 10*3/uL (ref 150.0–400.0)
RBC: 5.02 Mil/uL (ref 4.22–5.81)
RDW: 13.8 % (ref 11.5–15.5)
WBC: 5.6 10*3/uL (ref 4.0–10.5)

## 2022-09-18 LAB — COMPREHENSIVE METABOLIC PANEL
ALT: 35 U/L (ref 0–53)
AST: 32 U/L (ref 0–37)
Albumin: 4.4 g/dL (ref 3.5–5.2)
Alkaline Phosphatase: 58 U/L (ref 39–117)
BUN: 18 mg/dL (ref 6–23)
CO2: 26 meq/L (ref 19–32)
Calcium: 9.3 mg/dL (ref 8.4–10.5)
Chloride: 104 meq/L (ref 96–112)
Creatinine, Ser: 1.06 mg/dL (ref 0.40–1.50)
GFR: 80.72 mL/min (ref >60.00)
Glucose, Bld: 98 mg/dL (ref 70–99)
Potassium: 4 meq/L (ref 3.5–5.1)
Sodium: 138 meq/L (ref 135–145)
Total Bilirubin: 0.9 mg/dL (ref 0.2–1.2)
Total Protein: 7.5 g/dL (ref 6.0–8.3)

## 2022-09-18 LAB — LIPID PANEL
Cholesterol: 248 mg/dL — ABNORMAL HIGH (ref 0–200)
HDL: 42.3 mg/dL (ref >39.00)
LDL Cholesterol: 171 mg/dL — ABNORMAL HIGH (ref 0–99)
NonHDL: 205.95
Total CHOL/HDL Ratio: 6
Triglycerides: 176 mg/dL — ABNORMAL HIGH (ref 0.0–149.0)
VLDL: 35.2 mg/dL (ref 0.0–40.0)

## 2022-09-18 NOTE — Patient Instructions (Addendum)
Shingrix #1 today. Repeat injection in 2-5 months. Schedule a nurse visit for the 2nd injection before you leave today (at the check out desk)  Flu shot today  I agree with mild weight loss but maintaining muscle mass  Please stop by lab before you go If you have mychart- we will send your results within 3 business days of [place] receiving them.  If you do not have mychart- we will call you about results within 5 business days of [place] receiving them.  *please also note that you will see labs on mychart as soon as they post. I will later go in and write notes on them- will say "notes from Dr. Durene Cal"   Recommended follow up: Return in about 1 year (around 09/18/2023) for physical or sooner if needed.Schedule b4 you leave.
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
## 2022-11-20 ENCOUNTER — Ambulatory Visit (INDEPENDENT_AMBULATORY_CARE_PROVIDER_SITE_OTHER): Payer: 59

## 2022-11-20 DIAGNOSIS — Z23 Encounter for immunization: Secondary | ICD-10-CM | POA: Diagnosis not present

## 2023-01-16 ENCOUNTER — Other Ambulatory Visit: Payer: Self-pay | Admitting: Urology

## 2023-01-16 DIAGNOSIS — C61 Malignant neoplasm of prostate: Secondary | ICD-10-CM

## 2023-03-18 ENCOUNTER — Ambulatory Visit
Admission: RE | Admit: 2023-03-18 | Discharge: 2023-03-18 | Disposition: A | Discharge status: Home / Self Care | Payer: 59 | Source: Ambulatory Visit | Attending: Urology | Admitting: Urology

## 2023-03-18 DIAGNOSIS — C61 Malignant neoplasm of prostate: Secondary | ICD-10-CM

## 2023-03-18 MED ORDER — GADOPICLENOL 0.5 MMOL/ML IV SOLN
10.0000 mL | Freq: Once | INTRAVENOUS | Status: AC | PRN
  Administered 2023-03-18: 10 mL via INTRAVENOUS

## 2023-09-20 ENCOUNTER — Ambulatory Visit: Payer: 59 | Admitting: Family Medicine

## 2023-09-20 ENCOUNTER — Ambulatory Visit: Payer: Self-pay | Admitting: Family Medicine

## 2023-09-20 ENCOUNTER — Encounter: Payer: Self-pay | Admitting: Family Medicine

## 2023-09-20 VITALS — BP 130/78 | HR 75 | Temp 98.1°F | Ht 72.0 in | Wt 206.8 lb

## 2023-09-20 DIAGNOSIS — Z23 Encounter for immunization: Secondary | ICD-10-CM | POA: Diagnosis not present

## 2023-09-20 DIAGNOSIS — E782 Mixed hyperlipidemia: Secondary | ICD-10-CM | POA: Diagnosis not present

## 2023-09-20 DIAGNOSIS — Z131 Encounter for screening for diabetes mellitus: Secondary | ICD-10-CM

## 2023-09-20 DIAGNOSIS — D171 Benign lipomatous neoplasm of skin and subcutaneous tissue of trunk: Secondary | ICD-10-CM

## 2023-09-20 DIAGNOSIS — E663 Overweight: Secondary | ICD-10-CM

## 2023-09-20 DIAGNOSIS — Z1211 Encounter for screening for malignant neoplasm of colon: Secondary | ICD-10-CM

## 2023-09-20 DIAGNOSIS — Z0000 Encounter for general adult medical examination without abnormal findings: Secondary | ICD-10-CM

## 2023-09-20 LAB — CBC WITH DIFFERENTIAL/PLATELET
Basophils Absolute: 0 K/uL (ref 0.0–0.1)
Basophils Relative: 0.7 % (ref 0.0–3.0)
Eosinophils Absolute: 0 K/uL (ref 0.0–0.7)
Eosinophils Relative: 0.8 % (ref 0.0–5.0)
HCT: 44.9 % (ref 39.0–52.0)
Hemoglobin: 15 g/dL (ref 13.0–17.0)
Lymphocytes Relative: 30.5 % (ref 12.0–46.0)
Lymphs Abs: 1.3 K/uL (ref 0.7–4.0)
MCHC: 33.3 g/dL (ref 30.0–36.0)
MCV: 88.8 fl (ref 78.0–100.0)
Monocytes Absolute: 0.3 K/uL (ref 0.1–1.0)
Monocytes Relative: 7.2 % (ref 3.0–12.0)
Neutro Abs: 2.6 K/uL (ref 1.4–7.7)
Neutrophils Relative %: 60.8 % (ref 43.0–77.0)
Platelets: 226 K/uL (ref 150.0–400.0)
RBC: 5.06 Mil/uL (ref 4.22–5.81)
RDW: 13.6 % (ref 11.5–15.5)
WBC: 4.3 K/uL (ref 4.0–10.5)

## 2023-09-20 LAB — COMPREHENSIVE METABOLIC PANEL WITH GFR
ALT: 26 U/L (ref 0–53)
AST: 32 U/L (ref 0–37)
Albumin: 4.7 g/dL (ref 3.5–5.2)
Alkaline Phosphatase: 62 U/L (ref 39–117)
BUN: 16 mg/dL (ref 6–23)
CO2: 27 meq/L (ref 19–32)
Calcium: 9.5 mg/dL (ref 8.4–10.5)
Chloride: 105 meq/L (ref 96–112)
Creatinine, Ser: 1.17 mg/dL (ref 0.40–1.50)
GFR: 71.2 mL/min (ref >60.00)
Glucose, Bld: 102 mg/dL — ABNORMAL HIGH (ref 70–99)
Potassium: 4.3 meq/L (ref 3.5–5.1)
Sodium: 140 meq/L (ref 135–145)
Total Bilirubin: 1 mg/dL (ref 0.2–1.2)
Total Protein: 7.6 g/dL (ref 6.0–8.3)

## 2023-09-20 LAB — TSH: TSH: 2.62 u[IU]/mL (ref 0.35–5.50)

## 2023-09-20 LAB — HEMOGLOBIN A1C: Hgb A1c MFr Bld: 6.2 % (ref 4.6–6.5)

## 2023-09-20 NOTE — Patient Instructions (Addendum)
 Please stop by lab before you go If you have mychart- we will send your results within 3 business days of us  receiving them.  If you do not have mychart- we will call you about results within 5 business days of us  receiving them.  *please also note that you will see labs on mychart as soon as they post. I will later go in and write notes on them- will say notes from Dr. Katrinka Finn GI contact Please call to schedule visit and/or procedure IF you do not hear within a week Address: [address] [place], [place], [place] [address] Phone: [phone]   We have placed a referral for you today to [facility]- please call their # if  Phone: [phone] if you do not hear within a week   we discussed chest x-ray or pulmonary consult with ongoing cough- hed like to think this over and reach out to us  - would not need separate appointment with me- just with x-ray  Recommended follow up: Return in about 1 year (around 09/19/2024) for physical or sooner if needed.Schedule b4 you leave.

## 2023-09-20 NOTE — Progress Notes (Signed)
 Phone: [phone]   Subjective:  Patient presents today for their annual physical. Chief complaint-noted.   See problem oriented charting- ROS- full  review of systems was completed and negative  except for topics noted under acute/chronic concerns  The following were reviewed and entered/updated in epic: Past Medical History:  Diagnosis Date   Allergy     allergic rhinitis   HLD (hyperlipidemia)    Nosebleed    history of nosebleeds as child   Wheezing    exercise induced   Patient Active Problem List   Diagnosis Date Noted   Prostate cancer (HCC) 09/18/2022    Priority: High   GAD (generalized anxiety disorder) 10/08/2017    Priority: Medium    History of adenomatous polyp of colon 10/03/2016    Priority: Medium    Ocular migraine 12/27/2015    Priority: Medium    HYPERLIPIDEMIA 11/04/2006    Priority: Medium    Atypical chest pain 11/04/2006    Priority: Medium    Otitis media with effusion 02/02/2017    Priority: Low   Chronic cough 06/12/2012    Priority: Low   Lipoma 06/12/2012    Priority: Low   Allergic rhinitis 11/04/2006    Priority: Low   Esophagitis 11/04/2006    Priority: Low   Past Surgical History:  Procedure Laterality Date   KNEE ARTHROSCOPY Right 1990    wrestling injury   WISDOM TOOTH EXTRACTION  2014    Family History  Problem Relation Age of Onset   Multiple sclerosis Mother 1   Prostate cancer Father        14s or 52s   Hypertension Father    Thyroid  disease Father    Multiple sclerosis Maternal Uncle 40   Leukemia Maternal Uncle    Thyroid  disease Paternal Grandmother    Colon cancer Neg Hx     Medications- reviewed and updated Current Outpatient Medications  Medication Sig Dispense Refill   CINNAMON PO Take by mouth.     Garlic 100 MG TABS Take by mouth.     Ginger, Zingiber officinalis, (GINGER PO) Take 1 tablet by mouth daily.     Green Tea, Camellia sinensis, (GREEN TEA PO) Take 1 tablet by mouth daily.      LYCOPENE PO Take 1 tablet by mouth daily.     Multiple Vitamin (MULTIVITAMIN) tablet Take 1 tablet by mouth daily.     Omega-3 Fatty Acids (FISH OIL) 1000 MG CAPS Take 1 capsule by mouth daily.     saw palmetto 80 MG capsule Take 80 mg by mouth 2 (two) times daily.     Turmeric 500 MG CAPS Take by mouth.     No current facility-administered medications for this visit.    Allergies-reviewed and updated Allergies  Allergen Reactions   Omeprazole  Rash    Social History   Social History Narrative   Married--2 sons (see his wife kim as well) Sons 10 and 7 in 2017.       Occupation: Arts administrator (Geneticist, molecular) at [employer]   Objective  Objective:  BP 130/78 (BP Location: Left Arm, Patient Position: Sitting, Cuff Size: Normal)   Pulse 75   Temp 98.1 F (36.7 C) (Temporal)   Ht 6' (1.829 m)   Wt 206 lb 12.8 oz (93.8 kg)   SpO2 98%   BMI 28.05 kg/m  Gen: NAD, resting comfortably HEENT: Mucous membranes are moist. Oropharynx normal Neck: no thyromegaly CV: RRR no murmurs rubs or gallops Lungs: CTAB no crackles, wheeze,  rhonchi Abdomen: soft/nontender/nondistended/normal bowel sounds. No rebound or guarding.  Ext: no edema Skin: warm, dry Neuro: grossly normal, moves all extremities, PERRLA Normal testicular exam. No hernias.    Assessment and Plan  53 y.o. male presenting for annual physical.  Health Maintenance counseling: 1. Anticipatory guidance: Patient counseled regarding regular dental exams -q6 months, eye exams -yearly with Dr. Waylan,  avoiding smoking and second hand smoke , limiting alcohol to 2 beverages per day - not drinking at all lately, no illicit drugs .   2. Risk factor reduction:  Advised patient of need for regular exercise and diet rich and fruits and vegetables to reduce risk of heart attack and stroke.  Exercise-uses [employer] once a day-enjoys weights and has added some running back in and knees doing ok with this.  Diet/weight management-Down  15 pounds from last year!SABRA  lower weight of 182 from 2019. Tired to limit fast food and avoids processed meat Wt Readings from Last 3 Encounters:  09/20/23 206 lb 12.8 oz (93.8 kg)  09/18/22 221 lb 12.8 oz (100.6 kg)  08/23/22 224 lb (101.6 kg)  3. Immunizations/screenings/ancillary studies-flu shot today, offered Prevnar 20- waiting for now   Immunization History  Administered Date(s) Administered   Influenza Inj Mdck Quad Pf 10/29/2018   Influenza Whole 12/12/2009   Influenza, Seasonal, Injecte, Preservative Fre 09/18/2022   Influenza,inj,Quad PF,6+ Mos 11/05/2012, 10/21/2013, 11/19/2014, 12/27/2015, 12/27/2016, 10/08/2017, 12/22/2020   Influenza-Unspecified 09/10/2019   MMR 05/28/2009   PFIZER(Purple Top)SARS-COV-2 Vaccination 10/14/2019, 11/04/2019, 12/22/2020   Td 05/24/2009   Tdap 12/24/2019   Zoster Recombinant(Shingrix ) 09/18/2022, 11/20/2022   4. Prostate cancer screening-follows closely with urology-father had prostate cancer in 40s or 50s.  Under active surveillance for prostate cancer at present since December 2023 with upcoming biopsy as below   5. Colon cancer screening - September 2018 with 7-year follow-up planned-referred today 6. Skin cancer screening-sees [facility]- told questions 2 years. advised regular sunscreen use. Denies worrisome, changing, or new skin lesions.  7. Smoking associated screening (lung cancer screening, AAA screen 65-75, UA)-  smoker 8. STD screening - opts out as only active with wife  Status of chronic or acute concerns   #social update- wife lost job and looking, he lost his boss and doing more with less unfortunately- more stress. Has opted to limit caffeine- still feels somewhat jittery/on edge  # low risk Prostate cancer-discovered with biopsy December 2023-under active surveillance with Dr. Nieves  -Upcoming fusion biopsy in 2025  #hyperlipidemia- CT calcium 01/13/2018 score at ) on CT coronary morphology S: Medication:None.   Diet was not best last year- has improved some Lab Results  Component Value Date   CHOL 248 (H) 09/18/2022   HDL 42.30 09/18/2022   LDLCALC 171 (H) 09/18/2022   LDLDIRECT 148.9 10/24/2012   TRIG 176.0 (H) 09/18/2022   CHOLHDL 6 09/18/2022   A/P: update lipids- hopefully improved with improved diet   #Generalized anxiety disorder-more situational with work S: Recommended CBT October 2019.  Fortunately patient symptoms improved with decreased travel schedule-he did not end up getting therapy/counseling    09/20/2023    8:26 AM 09/18/2022    8:18 AM 03/31/2021   11:26 AM  Depression screen PHQ 2/9  Decreased Interest 0 0   Down, Depressed, Hopeless 0 0 0  PHQ - 2 Score 0 0 0  Altered sleeping 1  0  Tired, decreased energy 1  0  Change in appetite 0  0  Feeling bad or failure about yourself  0  0  Trouble concentrating 0  0  Moving slowly or fidgety/restless 0  0  Suicidal thoughts 0  0  PHQ-9 Score 2  0  Difficult doing work/chores Not difficult at all  Not difficult at all       09/20/2023    8:26 AM 09/18/2022    8:18 AM 03/31/2021   11:26 AM 10/08/2017    4:38 PM  GAD 7 : Generalized Anxiety Score  Nervous, Anxious, on Edge 0 0 0 1  Control/stop worrying 0 0 0 1  Worry too much - different things 0 0 0 2  Trouble relaxing 0 0 0 2  Restless 0 0 0 2  Easily annoyed or irritable 0 0 0 1  Afraid - awful might happen 0 0 0 2  Total GAD 7 Score 0 0 0 11  Anxiety Difficulty Not difficult at all Not difficult at all Not difficult at all Somewhat difficult  A/P: overall feels managing well despite stress but wants to monitor without  therapy or meds  #Ocular migraines-continues follow-up with [facility] regularly and also sees Dr. Waylan locally.  Denies change in symptoms or frequency. Still coming and going- maybe slightly worse with stress   #Numbness/tingling/warmth in hands and feet some lately- comes and goes. Has Raynaud but feels different. There were a few weeks every  other night but better. He has been running more and wonders if contributes. Has previously had some carpal tunnel -check a1c, TSH. He's ok holding on B12- takes vitamin  #Lipoma on right side-referred for surgical consult in 2023-needs new referral in 2024 and  2025- refer again today . Reports occasional painful around outside but goes back down  #occasionally mildly dizzy with standing- not always hydrating well but trying to improve   #cardiology- sees for palpitations or chest pain- usually stres srelated- sees them in november  #Chronic cough S: Ongoing cough issues since at least 2010.  Chest x-ray in 2014 with Dr. Georgian.  Apparently was also tested with PFTs and negative for asthma.  In the past thought most likely related to postnasal drip.  Did not tolerate trial of PPI-reports allergy  to omeprazole .  Zantac was not helpful with cough. A/P: we discussed chest x-ray or pulmonary consult with ongoing cough- hed like to think this over and reach out to us    Recommended follow up: Return in about 1 year (around 09/19/2024) for physical or sooner if needed.Schedule b4 you leave. Future Appointments  Date Time Provider Department Center  11/25/2023  4:20 PM Lonni Slain, MD [facility] [phone] [facility]   Lab/Order associations: fasting   ICD-10-CM   1. Preventative health care  Z00.00     2. Need for influenza vaccination  Z23 Flu vaccine trivalent PF, 6mos and older(Flulaval,Afluria,Fluarix,Fluzone)    3. Lipoma of torso  D17.1 Ambulatory referral to General Surgery    4. Mixed hyperlipidemia  E78.2 Lipid panel    CBC w/Diff    Comp Met (CMET)    TSH    5. Screen for colon cancer  Z12.11 Ambulatory referral to Gastroenterology    6. Screening for diabetes mellitus  Z13.1 Hemoglobin A1c    7. Overweight  E66.3 Hemoglobin A1c      No orders of the defined types were placed in this encounter.   Return precautions advised.  Garnette Lukes, MD

## 2023-09-21 LAB — LIPID PANEL
Cholesterol: 217 mg/dL — ABNORMAL HIGH (ref <200)
HDL: 46 mg/dL (ref >=40)
LDL Cholesterol (Calc): 151 mg/dL — ABNORMAL HIGH
Non-HDL Cholesterol (Calc): 171 mg/dL — ABNORMAL HIGH (ref <130)
Total CHOL/HDL Ratio: 4.7 (calc) (ref <5.0)
Triglycerides: 91 mg/dL (ref <150)

## 2023-11-25 ENCOUNTER — Encounter (HOSPITAL_BASED_OUTPATIENT_CLINIC_OR_DEPARTMENT_OTHER): Payer: Self-pay | Admitting: Cardiology

## 2023-11-25 ENCOUNTER — Ambulatory Visit (INDEPENDENT_AMBULATORY_CARE_PROVIDER_SITE_OTHER): Admitting: Cardiology

## 2023-11-25 VITALS — BP 136/68 | HR 76 | Ht 72.0 in | Wt 203.5 lb

## 2023-11-25 DIAGNOSIS — Z8249 Family history of ischemic heart disease and other diseases of the circulatory system: Secondary | ICD-10-CM

## 2023-11-25 DIAGNOSIS — E7800 Pure hypercholesterolemia, unspecified: Secondary | ICD-10-CM

## 2023-11-25 DIAGNOSIS — R030 Elevated blood-pressure reading, without diagnosis of hypertension: Secondary | ICD-10-CM | POA: Diagnosis not present

## 2023-11-25 DIAGNOSIS — Z7189 Other specified counseling: Secondary | ICD-10-CM

## 2023-11-25 NOTE — Patient Instructions (Signed)

## 2023-11-25 NOTE — Progress Notes (Signed)
 Cardiology Office Note:  .    Date:  11/25/2023  ID:  Helene GORMAN Craft, DOB 11/29/1970, MRN [id number] PCP: Katrinka Garnette KIDD, MD  [facility] Providers Cardiologist:  Shelda Bruckner, MD     History of Present Illness: Stephen Bridges is a 53 y.o. male with a hx of family history of CV disease, hyperlipidemia, anxiety, who is seen for follow up. I initially saw him 11/22/2017 as a new consult at the request of Katrinka Garnette KIDD, MD for the evaluation and management of chest pain.   Cardiac history: several years of sharp/tense left sided chest pain with occasional radiation to neck and arms. Worse with stress.  -cardiac CTA 01/14/18 normal, no calcium -high cholesterol, never been on medications. Highest is LDL 177, lowest 143. Totals over 200, highest 240.  -POET normal 02/06/2017,  -Family history: high cholesterol in father, brother, maternal aunts. Bruna Bamberger with aneurysm, Bruna Lee with stroke. Mat Gpa with emphysema, Mat Gma with liver failure. Mom has multiple sclerosis or similar. No early onset CAD, no sudden cardiac death.   Today: Prostate cancer is progressing but slowly, following closely. Under a lot of stress. Doesn't check BP at home regularly, but at PCP visit recently BP was well controlled. Not on any medications for blood pressure. Has lost 20 lbs in the last year. Exercises at the gym, runs 3 miles 5 days/week, does weight training. Eating well, avoids processed foods. Not sleeping well. Was prediabetic on labs (A1c 6.2). We discussed lifestyle at length, see below.   ROS:  Denies chest pain, shortness of breath at rest or with normal exertion. No PND, orthopnea, LE edema or unexpected weight gain. No syncope or palpitations. ROS otherwise negative except as noted.   Studies Reviewed: SABRA    EKG Interpretation Date/Time:  Monday November 25 2023 16:50:23 EST Ventricular Rate:  62 PR Interval:  170 QRS Duration:  100 QT Interval:  392 QTC Calculation: 397 R  Axis:   34  Text Interpretation: Normal sinus rhythm Normal ECG When compared with ECG of 23-Aug-2022 16:23, No significant change was found Confirmed by Bruckner Shelda [phone]) on 11/25/2023 5:29:58 PM    Physical Exam:    VS:  BP 136/68 (Cuff Size: Normal)   Pulse 76   Ht 6' (1.829 m)   Wt 203 lb 8 oz (92.3 kg)   SpO2 99%   BMI 27.60 kg/m    Wt Readings from Last 3 Encounters:  11/25/23 203 lb 8 oz (92.3 kg)  09/20/23 206 lb 12.8 oz (93.8 kg)  09/18/22 221 lb 12.8 oz (100.6 kg)    GEN: Well nourished, well developed in no acute distress HEENT: Normal, moist mucous membranes NECK: No JVD CARDIAC: regular rhythm, normal S1 and S2, no rubs or gallops. No murmur. VASCULAR: Radial and DP pulses 2+ bilaterally. No carotid bruits RESPIRATORY:  Clear to auscultation without rales, wheezing or rhonchi  ABDOMEN: Soft, non-tender, non-distended MUSCULOSKELETAL:  Ambulates independently SKIN: Warm and dry, no edema NEUROLOGIC:  Alert and oriented x 3. No focal neuro deficits noted. PSYCHIATRIC:  Normal affect    ASSESSMENT AND PLAN: .    Hypercholesterolemia:  -strong family history, which is likely his cause of elevated LDL -calcium score of 0 very helpful in risk stratification (low risk), no significant plaque on CCTA in 2020 -lp(a) normal 2020 -hsCRP normal 2020 -reviewed lipids from 09/20/23: Tchol 217, HDL 46, TG 91, LDL 151 (LDL 171, TG 176 in 2024) -  discussed repeat Ca score; he did not have significant plaque in 2020, discussed pathology of plaque and typical timeline. After shared decision making, will not repeat at this time, will continue to discuss in the future.  Elevated blood pressure reading without diagnosis of hypertension -very stressed currently, improved on recheck  -recently well controlled at PCP visit -discussed checking at home, will contact me if consistently more than 140/90  Prediabetes -A1c 6.2 -excellent lifestyle as above -discussed avoiding  simple carbs/high glycemic index foods  Primary prevention: CV risk counseling and prevention counseling, with family history of heart disease -recommend heart healthy/Mediterranean diet, with whole grains, fruits, vegetable, fish, lean meats, nuts, and olive oil. Limit salt. -recommend moderate walking, 3-5 times/week for 30-50 minutes each session. Aim for at least 150 minutes.week. Goal should be pace of 3 miles/hours, or walking 1.5 miles in 30 minutes -recommend avoidance of tobacco products. Avoid excess alcohol. -ASCVD risk score: The 10-year ASCVD risk score (Arnett DK, et al., 2019) is: 6.2%   Values used to calculate the score:     Age: 18 years     Clincally relevant sex: Male     Is Non-Hispanic African American: No     Diabetic: No     Tobacco smoker: No     Systolic Blood Pressure: 136 mmHg     Is BP treated: No     HDL Cholesterol: 46 mg/dL     Total Cholesterol: 217 mg/dL   Dispo: Follow-up in 1 year, or sooner as needed.  Signed, Shelda Bruckner, MD

## 2023-12-27 ENCOUNTER — Encounter: Payer: Self-pay | Admitting: Gastroenterology

## 2024-01-22 ENCOUNTER — Ambulatory Visit

## 2024-01-22 VITALS — Ht 72.0 in | Wt 196.0 lb

## 2024-01-22 DIAGNOSIS — Z860100 Personal history of colon polyps, unspecified: Secondary | ICD-10-CM

## 2024-01-22 MED ORDER — NA SULFATE-K SULFATE-MG SULF 17.5-3.13-1.6 GM/177ML PO SOLN: 1.0000 | Freq: Once | ORAL | 0 refills | Status: AC

## 2024-01-22 NOTE — Progress Notes (Signed)
 PCP MD at time of PV: Garnette Lukes, MD __________________________________________________________________________________________________________________________________________  No egg allergy known to patient  No soy allergy known to patient No issues known to pt with past sedation with any surgeries or procedures Patient denies ever being told they had issues or difficulty with intubation  No FH of Malignant Hyperthermia Pt is not on diet pills Pt is not on  home 02  Pt is not on blood thinners  No A fib or A flutter Have any cardiac testing pending-- no  LOA: independent  No Chew or Snuff tobacco __________________________________________________________________________________________________________________________________________  Constipation: no  Prep: suprep  __________________________________________________________________________________________________________________________________________  PV completed with patient. Prep instructions reviewed and provided during apt. Rx sent to preferred pharmacy.  __________________________________________________________________________________________________________________________________________  Patient's chart reviewed by Norleen Schillings CNRA prior to previsit and patient appropriate for the [facility].  Previsit completed and red dot placed by patient's name on their procedure day (on provider's schedule).

## 2024-01-27 ENCOUNTER — Encounter: Payer: Self-pay | Admitting: Gastroenterology

## 2024-02-05 ENCOUNTER — Telehealth: Payer: Self-pay | Admitting: Gastroenterology

## 2024-02-05 ENCOUNTER — Ambulatory Visit: Admitting: Gastroenterology

## 2024-02-05 ENCOUNTER — Encounter: Payer: Self-pay | Admitting: Gastroenterology

## 2024-02-05 VITALS — BP 103/64 | HR 50 | Temp 97.0°F | Resp 13 | Ht 72.0 in

## 2024-02-05 DIAGNOSIS — K641 Second degree hemorrhoids: Secondary | ICD-10-CM

## 2024-02-05 DIAGNOSIS — K648 Other hemorrhoids: Secondary | ICD-10-CM

## 2024-02-05 DIAGNOSIS — Z1211 Encounter for screening for malignant neoplasm of colon: Secondary | ICD-10-CM

## 2024-02-05 DIAGNOSIS — Z860100 Personal history of colon polyps, unspecified: Secondary | ICD-10-CM

## 2024-02-05 DIAGNOSIS — Z860101 Personal history of adenomatous and serrated colon polyps: Secondary | ICD-10-CM | POA: Diagnosis not present

## 2024-02-05 MED ORDER — SODIUM CHLORIDE 0.9 % IV SOLN: 500.0000 mL | Freq: Once | INTRAVENOUS | Status: DC

## 2024-02-05 NOTE — Patient Instructions (Addendum)
 Resume previous diet and medications. Repeat Colonoscopy tomorrow 02/06/24. Follow directions on pre procedure instructions.   YOU HAD AN ENDOSCOPIC PROCEDURE TODAY AT THE [facility]:   Refer to the procedure report that was given to you for any specific questions about what was found during the examination.  If the procedure report does not answer your questions, please call your gastroenterologist to clarify.  If you requested that your care partner not be given the details of your procedure findings, then the procedure report has been included in a sealed envelope for you to review at your convenience later.  YOU SHOULD EXPECT: Some feelings of bloating in the abdomen. Passage of more gas than usual.  Walking can help get rid of the air that was put into your GI tract during the procedure and reduce the bloating. If you had a lower endoscopy (such as a colonoscopy or flexible sigmoidoscopy) you may notice spotting of blood in your stool or on the toilet paper. If you underwent a bowel prep for your procedure, you may not have a normal bowel movement for a few days.  Please Note:  You might notice some irritation and congestion in your nose or some drainage.  This is from the oxygen used during your procedure.  There is no need for concern and it should clear up in a day or so.  SYMPTOMS TO REPORT IMMEDIATELY:  Following lower endoscopy (colonoscopy or flexible sigmoidoscopy):  Excessive amounts of blood in the stool  Significant tenderness or worsening of abdominal pains  Swelling of the abdomen that is new, acute  Fever of 100F or higher  For urgent or emergent issues, a gastroenterologist can be reached at any hour by calling [phone]. Do not use MyChart messaging for urgent concerns.    DIET:  We do recommend a small meal at first, but then you may proceed to your regular diet.  Drink plenty of fluids but you should avoid alcoholic beverages for 24 hours.  ACTIVITY:   You should plan to take it easy for the rest of today and you should NOT DRIVE or use heavy machinery until tomorrow (because of the sedation medicines used during the test).    FOLLOW UP: Our staff will call the number listed on your records the next business day following your procedure.  We will call around 7:15- 8:00 am to check on you and address any questions or concerns that you may have regarding the information given to you following your procedure. If we do not reach you, we will leave a message.     If any biopsies were taken you will be contacted by phone or by letter within the next 1-3 weeks.  Please call us  at [phone] if you have not heard about the biopsies in 3 weeks.    SIGNATURES/CONFIDENTIALITY: You and/or your care partner have signed paperwork which will be entered into your electronic medical record.  These signatures attest to the fact that that the information above on your After Visit Summary has been reviewed and is understood.  Full responsibility of the confidentiality of this discharge information lies with you and/or your care-partner.

## 2024-02-05 NOTE — Progress Notes (Signed)
 Vss nad trans to pacu
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
## 2024-02-05 NOTE — Op Note (Signed)
 [facility] Patient Name: Stephen Bridges Procedure Date: 02/05/2024 10:30 AM MRN: [id number] Endoscopist: Sandor Flatter , MD, [id number] Age: 54 Referring MD:  Date of Birth: 1970-11-24 Gender: Male Account #: [id number] Procedure:                Colonoscopy Indications:              High risk colon cancer surveillance: Personal                            history of non-advanced adenoma                           Last colonoscopy was 09/2016 and notable for 2                            subcentimeter adenomas resected from the sigmoid                            colon and cecum, with recommendation to repeat in 5                            years. Medicines:                Monitored Anesthesia Care Procedure:                Pre-Anesthesia Assessment:                           - Prior to the procedure, a History and Physical                            was performed, and patient medications and                            allergies were reviewed. The patient's tolerance of                            previous anesthesia was also reviewed. The risks                            and benefits of the procedure and the sedation                            options and risks were discussed with the patient.                            All questions were answered, and informed consent                            was obtained. Prior Anticoagulants: The patient has                            taken no anticoagulant or antiplatelet agents. ASA  Grade Assessment: II - A patient with mild systemic                            disease. After reviewing the risks and benefits,                            the patient was deemed in satisfactory condition to                            undergo the procedure.                           After obtaining informed consent, the colonoscope                            was passed under direct vision. Throughout the                             procedure, the patient's blood pressure, pulse, and                            oxygen saturations were monitored continuously. The                            CF [id number] #[id number] was introduced through the anus                            and advanced to the the cecum, identified by                            appendiceal orifice and ileocecal valve. The                            colonoscopy was technically difficult and complex                            due to inadequate bowel prep and significant                            looping. Successful completion of the procedure was                            aided by using manual pressure, straightening and                            shortening the scope to obtain bowel loop reduction                            and lavage. The patient tolerated the procedure                            well. The quality of the bowel preparation was  fair. The ileocecal valve, appendiceal orifice, and                            rectum were photographed. Scope In: 10:40:27 AM Scope Out: 10:54:30 AM Scope Withdrawal Time: 0 hours 7 minutes 13 seconds  Total Procedure Duration: 0 hours 14 minutes 3 seconds  Findings:                 The perianal and digital rectal examinations were                            normal.                           A moderate amount of stool and solid food debris                            (ie, seeds, nuts) was found scattered throughou the                            entire colon, interfering with visualization.                            Lavage of the area was performed using copious                            amounts of sterile water, resulting in incomplete                            clearance with fair visualization.                           The ascending colon revealed moderately excessive                            looping. Advancing the scope required using manual                            pressure and  straightening and shortening the scope                            to obtain bowel loop reduction.                           Non-bleeding internal hemorrhoids were found during                            retroflexion. The hemorrhoids were small. Complications:            No immediate complications. Estimated Blood Loss:     Estimated blood loss: none. Impression:               - Preparation of the colon was fair with retained                            stool and solid food debris scattered throughout  the colon.                           - Stool in the entire examined colon.                           - There was significant looping of the colon.                           - Non-bleeding internal hemorrhoids.                           - No specimens collected. Recommendation:           - Patient has a contact number available for                            emergencies. The signs and symptoms of potential                            delayed complications were discussed with the                            patient. Return to normal activities tomorrow.                            Written discharge instructions were provided to the                            patient.                           - Resume previous diet.                           - Continue present medications.                           - Repeat colonoscopy within 1 year because the                            bowel preparation was suboptimal. Will plan on                            extended 2-day prep and avoidance of seeds, nuts,                            etc for 7 days prior.                           - Return to GI clinic PRN. Sandor Flatter, MD 02/05/2024 11:02:57 AM

## 2024-02-05 NOTE — Progress Notes (Signed)
 Pt's states no medical or surgical changes since previsit or office visit.

## 2024-02-05 NOTE — Telephone Encounter (Signed)
 Patient called stating the prep instructions he was told after today's colonoscopy attempt is not the same instructions on the paper. Requesting a call to confirm instructions. Please advise, thank you

## 2024-02-05 NOTE — Telephone Encounter (Signed)
 Returned pt's call. Pt states he was told to drink the 119gm of Miralax mixed with 32 oz of gatorade tonight and that he did not need to drink the prep tomorrow morning. RN reviewed instructions that did read differently, but was able to determine that what pt remembered being told was the correct way he is supposed to drink the prep. Encouraged pt to drink as much clear liquids as he can tolerate to help the bowel prep clear his colon. Pt verbalized understanding.

## 2024-02-06 ENCOUNTER — Encounter: Payer: Self-pay | Admitting: Internal Medicine

## 2024-02-06 ENCOUNTER — Ambulatory Visit: Admitting: Internal Medicine

## 2024-02-06 VITALS — BP 113/61 | HR 54 | Temp 98.1°F | Resp 12 | Ht 72.0 in | Wt 196.0 lb

## 2024-02-06 DIAGNOSIS — K648 Other hemorrhoids: Secondary | ICD-10-CM

## 2024-02-06 DIAGNOSIS — Z1211 Encounter for screening for malignant neoplasm of colon: Secondary | ICD-10-CM

## 2024-02-06 DIAGNOSIS — K635 Polyp of colon: Secondary | ICD-10-CM

## 2024-02-06 DIAGNOSIS — D123 Benign neoplasm of transverse colon: Secondary | ICD-10-CM | POA: Diagnosis not present

## 2024-02-06 DIAGNOSIS — D124 Benign neoplasm of descending colon: Secondary | ICD-10-CM | POA: Diagnosis not present

## 2024-02-06 DIAGNOSIS — Z860100 Personal history of colon polyps, unspecified: Secondary | ICD-10-CM | POA: Diagnosis not present

## 2024-02-06 DIAGNOSIS — D122 Benign neoplasm of ascending colon: Secondary | ICD-10-CM

## 2024-02-06 MED ORDER — SODIUM CHLORIDE 0.9 % IV SOLN: 500.0000 mL | Freq: Once | INTRAVENOUS | Status: AC

## 2024-02-06 NOTE — Patient Instructions (Signed)
 YOU HAD AN ENDOSCOPIC PROCEDURE TODAY AT THE [facility]:   Refer to the procedure report that was given to you for any specific questions about what was found during the examination.  If the procedure report does not answer your questions, please call your gastroenterologist to clarify.  If you requested that your care partner not be given the details of your procedure findings, then the procedure report has been included in a sealed envelope for you to review at your convenience later.  YOU SHOULD EXPECT: Some feelings of bloating in the abdomen. Passage of more gas than usual.  Walking can help get rid of the air that was put into your GI tract during the procedure and reduce the bloating. If you had a lower endoscopy (such as a colonoscopy or flexible sigmoidoscopy) you may notice spotting of blood in your stool or on the toilet paper. If you underwent a bowel prep for your procedure, you may not have a normal bowel movement for a few days.  Please Note:  You might notice some irritation and congestion in your nose or some drainage.  This is from the oxygen used during your procedure.  There is no need for concern and it should clear up in a day or so.  SYMPTOMS TO REPORT IMMEDIATELY:  Following lower endoscopy (colonoscopy or flexible sigmoidoscopy):  Excessive amounts of blood in the stool  Significant tenderness or worsening of abdominal pains  Swelling of the abdomen that is new, acute  Fever of 100F or higher   For urgent or emergent issues, a gastroenterologist can be reached at any hour by calling [phone]. Do not use MyChart messaging for urgent concerns.    DIET:  We do recommend a small meal at first, but then you may proceed to your regular diet.  Drink plenty of fluids but you should avoid alcoholic beverages for 24 hours.  MEDICATIONS: Continue present medications.  FOLLOW UP: Await pathology results.  Educational materials given to patient: Polyps,  Hemorrhoids.  Thank you for allowing us  to provide for your healthcare needs today.  ACTIVITY:  You should plan to take it easy for the rest of today and you should NOT DRIVE or use heavy machinery until tomorrow (because of the sedation medicines used during the test).    FOLLOW UP: Our staff will call the number listed on your records the next business day following your procedure.  We will call around 7:15- 8:00 am to check on you and address any questions or concerns that you may have regarding the information given to you following your procedure. If we do not reach you, we will leave a message.     If any biopsies were taken you will be contacted by phone or by letter within the next 1-3 weeks.  Please call us  at [phone] if you have not heard about the biopsies in 3 weeks.    SIGNATURES/CONFIDENTIALITY: You and/or your care partner have signed paperwork which will be entered into your electronic medical record.  These signatures attest to the fact that that the information above on your After Visit Summary has been reviewed and is understood.  Full responsibility of the confidentiality of this discharge information lies with you and/or your care-partner.

## 2024-02-06 NOTE — Op Note (Signed)
 [facility] Patient Name: Stephen Bridges Procedure Date: 02/06/2024 10:36 AM MRN: [id number] Endoscopist: Rosario Estefana Kidney , , [id number] Age: 54 Referring MD:  Date of Birth: 04/11/1970 Gender: Male Account #: [id number] Procedure:                Colonoscopy Indications:              High risk colon cancer surveillance: Personal                            history of colonic polyps Medicines:                Monitored Anesthesia Care Procedure:                Pre-Anesthesia Assessment:                           - Prior to the procedure, a History and Physical                            was performed, and patient medications and                            allergies were reviewed. The patient's tolerance of                            previous anesthesia was also reviewed. The risks                            and benefits of the procedure and the sedation                            options and risks were discussed with the patient.                            All questions were answered, and informed consent                            was obtained. Prior Anticoagulants: The patient has                            taken no anticoagulant or antiplatelet agents. ASA                            Grade Assessment: II - A patient with mild systemic                            disease. After reviewing the risks and benefits,                            the patient was deemed in satisfactory condition to                            undergo the procedure.  After obtaining informed consent, the colonoscope                            was passed under direct vision. Throughout the                            procedure, the patient's blood pressure, pulse, and                            oxygen saturations were monitored continuously. The                            Olympus Scope SN: [id number] was introduced through                            the anus and advanced to the the  terminal ileum.                            The colonoscopy was performed without difficulty.                            The patient tolerated the procedure well. The                            quality of the bowel preparation was good. The                            terminal ileum, ileocecal valve, appendiceal                            orifice, and rectum were photographed. Scope In: 10:51:46 AM Scope Out: 11:24:06 AM Scope Withdrawal Time: 0 hours 26 minutes 49 seconds  Total Procedure Duration: 0 hours 32 minutes 20 seconds  Findings:                 The terminal ileum appeared normal.                           Six sessile polyps were found in the transverse                            colon and ascending colon. The polyps were 3 to 6                            mm in size. These polyps were removed with a cold                            snare. Resection and retrieval were complete.                           Two sessile polyps were found in the descending                            colon. The polyps were  3 to 4 mm in size. These                            polyps were removed with a cold snare. Resection                            and retrieval were complete.                           Non-bleeding internal hemorrhoids were found during                            retroflexion. Complications:            No immediate complications. Estimated Blood Loss:     Estimated blood loss was minimal. Impression:               - The examined portion of the ileum was normal.                           - Six 3 to 6 mm polyps in the transverse colon and                            in the ascending colon, removed with a cold snare.                            Resected and retrieved.                           - Two 3 to 4 mm polyps in the descending colon,                            removed with a cold snare. Resected and retrieved.                           - Non-bleeding internal  hemorrhoids. Recommendation:           - Discharge patient to home (with escort).                           - Await pathology results.                           - The findings and recommendations were discussed                            with the patient. Dr Estefana Federico Rosario Estefana Federico,  02/06/2024 11:27:48 AM

## 2024-02-06 NOTE — Progress Notes (Signed)
 Pt sedate, gd SR's, VSS, report to RN

## 2024-02-06 NOTE — Progress Notes (Signed)
 Called to room to assist during endoscopic procedure.  Patient ID and intended procedure confirmed with present staff. Received instructions for my participation in the procedure from the performing physician.
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
## 2024-02-07 ENCOUNTER — Telehealth: Payer: Self-pay

## 2024-02-11 LAB — SURGICAL PATHOLOGY

## 2024-02-12 ENCOUNTER — Ambulatory Visit: Payer: Self-pay | Admitting: Internal Medicine
